# Patient Record
Sex: Female | Born: 1970
Health system: Southern US, Community
[De-identification: ages and names within clinical notes are randomized; demographics above are authoritative.]

## PROBLEM LIST (undated history)

## (undated) DIAGNOSIS — B9689 Other specified bacterial agents as the cause of diseases classified elsewhere: Secondary | ICD-10-CM

## (undated) DIAGNOSIS — Z8659 Personal history of other mental and behavioral disorders: Secondary | ICD-10-CM

## (undated) DIAGNOSIS — K219 Gastro-esophageal reflux disease without esophagitis: Secondary | ICD-10-CM

## (undated) DIAGNOSIS — N760 Acute vaginitis: Secondary | ICD-10-CM

## (undated) DIAGNOSIS — D649 Anemia, unspecified: Secondary | ICD-10-CM

## (undated) DIAGNOSIS — F419 Anxiety disorder, unspecified: Secondary | ICD-10-CM

## (undated) DIAGNOSIS — Z9851 Tubal ligation status: Secondary | ICD-10-CM

## (undated) DIAGNOSIS — Z87440 Personal history of urinary (tract) infections: Secondary | ICD-10-CM

## (undated) DIAGNOSIS — N946 Dysmenorrhea, unspecified: Secondary | ICD-10-CM

## (undated) DIAGNOSIS — A419 Sepsis, unspecified organism: Secondary | ICD-10-CM

## (undated) DIAGNOSIS — N12 Tubulo-interstitial nephritis, not specified as acute or chronic: Secondary | ICD-10-CM

## (undated) DIAGNOSIS — R102 Pelvic and perineal pain unspecified side: Secondary | ICD-10-CM

## (undated) HISTORY — DX: Pelvic and perineal pain: R10.2

## (undated) HISTORY — DX: Other specified bacterial agents as the cause of diseases classified elsewhere: B96.89

## (undated) HISTORY — DX: Anemia, unspecified: D64.9

## (undated) HISTORY — DX: Sepsis, unspecified organism: A41.9

## (undated) HISTORY — PX: OTHER SURGICAL HISTORY: SHX169

## (undated) HISTORY — DX: Tubal ligation status: Z98.51

## (undated) HISTORY — PX: ABDOMINAL HYSTERECTOMY: SHX81

## (undated) HISTORY — DX: Anxiety disorder, unspecified: F41.9

## (undated) HISTORY — DX: Acute vaginitis: N76.0

## (undated) HISTORY — DX: Pelvic and perineal pain unspecified side: R10.20

## (undated) HISTORY — DX: Gastro-esophageal reflux disease without esophagitis: K21.9

## (undated) HISTORY — DX: Tubulo-interstitial nephritis, not specified as acute or chronic: N12

## (undated) HISTORY — PX: VAGINAL HYSTERECTOMY: SHX2639

## (undated) HISTORY — DX: Personal history of other mental and behavioral disorders: Z86.59

## (undated) HISTORY — DX: Personal history of urinary (tract) infections: Z87.440

## (undated) HISTORY — DX: Dysmenorrhea, unspecified: N94.6

---

## 2002-03-10 ENCOUNTER — Emergency Department (HOSPITAL_COMMUNITY): Admission: EM | Admit: 2002-03-10 | Discharge: 2002-03-11 | Payer: Self-pay | Admitting: Emergency Medicine

## 2002-07-08 ENCOUNTER — Emergency Department (HOSPITAL_COMMUNITY): Admission: EM | Admit: 2002-07-08 | Discharge: 2002-07-08 | Payer: Self-pay

## 2007-09-30 ENCOUNTER — Emergency Department (HOSPITAL_BASED_OUTPATIENT_CLINIC_OR_DEPARTMENT_OTHER): Admission: EM | Admit: 2007-09-30 | Discharge: 2007-10-01 | Payer: Self-pay | Admitting: Emergency Medicine

## 2008-02-23 ENCOUNTER — Encounter: Payer: Self-pay | Admitting: Emergency Medicine

## 2008-02-24 ENCOUNTER — Inpatient Hospital Stay (HOSPITAL_COMMUNITY): Admission: EM | Admit: 2008-02-24 | Discharge: 2008-02-27 | Payer: Self-pay | Admitting: Internal Medicine

## 2008-03-17 ENCOUNTER — Ambulatory Visit (HOSPITAL_COMMUNITY): Admission: RE | Admit: 2008-03-17 | Discharge: 2008-03-17 | Payer: Self-pay | Admitting: Urology

## 2009-12-06 ENCOUNTER — Encounter: Payer: Self-pay | Admitting: Obstetrics and Gynecology

## 2009-12-06 ENCOUNTER — Inpatient Hospital Stay (HOSPITAL_COMMUNITY): Admission: RE | Admit: 2009-12-06 | Discharge: 2009-12-07 | Payer: Self-pay | Admitting: Obstetrics and Gynecology

## 2010-06-09 LAB — CBC
HCT: 29.9 % — ABNORMAL LOW (ref 36.0–46.0)
HCT: 38 % (ref 36.0–46.0)
Hemoglobin: 10.2 g/dL — ABNORMAL LOW (ref 12.0–15.0)
MCH: 30.8 pg (ref 26.0–34.0)
MCH: 30.9 pg (ref 26.0–34.0)
MCV: 90.9 fL (ref 78.0–100.0)
RBC: 3.32 MIL/uL — ABNORMAL LOW (ref 3.87–5.11)
RDW: 13.6 % (ref 11.5–15.5)
WBC: 8.9 10*3/uL (ref 4.0–10.5)

## 2010-06-09 LAB — BASIC METABOLIC PANEL
BUN: 6 mg/dL (ref 6–23)
CO2: 28 mEq/L (ref 19–32)
Calcium: 9.3 mg/dL (ref 8.4–10.5)
Chloride: 106 mEq/L (ref 96–112)
Creatinine, Ser: 0.71 mg/dL (ref 0.4–1.2)
GFR calc Af Amer: >60 mL/min (ref >60)
GFR calc non Af Amer: >60 mL/min (ref >60)
Glucose, Bld: 95 mg/dL (ref 70–99)
Potassium: 3.8 mEq/L (ref 3.5–5.1)
Sodium: 140 mEq/L (ref 135–145)

## 2010-06-09 LAB — CREATININE, SERUM
Creatinine, Ser: 0.59 mg/dL (ref 0.4–1.2)
GFR calc Af Amer: >60 mL/min (ref >60)

## 2010-06-09 LAB — HCG, SERUM, QUALITATIVE: Preg, Serum: NEGATIVE

## 2010-08-09 NOTE — Discharge Summary (Signed)
NAME:  Kaitlin Jenkins, Kaitlin Jenkins NO.:  [id number]   MEDICAL RECORD NO.:  [id number]          PATIENT TYPE:  INP   LOCATION:  1516                         FACILITY:  [facility]   PHYSICIAN:  Hillery Aldo, M.D.   DATE OF BIRTH:  1970/04/28   DATE OF ADMISSION:  02/24/2008  DATE OF DISCHARGE:  02/27/2008                               DISCHARGE SUMMARY   PRIMARY CARE PHYSICIAN:  None.   DISCHARGE DIAGNOSES:  1. Sepsis.  2. Right pyelonephritis.  3. Bacterial vaginosis.  4. Mild normocytic anemia.  5. Gastroesophageal reflux disease.  6. History of depression.  7. History of recurrent urinary tract infections.   DISCHARGE MEDICATIONS:  1. Ceftin 500 mg b.i.d. times 11 days.  2. Zofran oral dissolving tablet 4 mg sublingual q. 6 hours p.r.n.      nausea.  3  Vicodin 5/500 one to two tabs q.6 h p.r.n. pain.  1. Flagyl 500 mg q.8 hours times 4 more days.  2. Protonix 40 mg daily p.r.n. acid reflux symptoms.  3. Zoloft 100 mg daily.   CONSULTATIONS:  None.   BRIEF ADMISSION HPI:  The patient is a 40 year old female with past  medical history of recurrent urinary tract infections who presented to  the hospital with chief complaint of fever, chills and right-sided flank  and abdominal pain.  The pain progressed to the point where she  developed persistent nausea and vomiting and was unable to keep any food  or liquid down and therefore presented to the emergency department for  evaluation.  For full details, please see the dictated report done by  Dr. Lovell Sheehan.   PROCEDURES AND DIAGNOSTIC STUDIES:  1. CT scan of the abdomen and pelvis on February 23, 2008, showed      findings of right-sided pyelonephritis without abscess.  No renal      calculi.  Suboptimal vascular enhancement of indeterminate      etiology.  Normal appendix.  Normal pelvis.  2. Two views of the chest on February 25, 2008, showed probable      bibasilar atelectasis with tiny effusions.   DISCHARGE  LABORATORY VALUES:  Sodium was 143, potassium 4.6, chloride  107, bicarb 32, BUN 7, creatinine 0.84, glucose 94.  White blood cell  count was 6.2, hemoglobin 11.2, hematocrit 33, platelets 284.   HOSPITAL COURSE:  1. Sepsis secondary to right-sided pyelonephritis:  The patient was      admitted to the ICU because she was initially hypotensive.  She was      fluid volume resuscitated and put on a combination of Cipro and      Rocephin.  She did develop some pruritus of the hand and arm where      the Cipro was actively infusing and therefore Cipro was immediately      discontinued.  The patient remained on Rocephin monotherapy.      Unfortunately, urine cultures were not sent on initial presentation      before the patient received antibiotics and subsequent urine      culture was sent after antibiotics were already initiated.  These  cultures were sterile.  Nevertheless, the patient seems to be      improving on empiric Rocephin therapy and therefore will be      switched over to p.o. Ceftin for a full 2 week course of antibiotic      therapy.  Because of her recurrent problems with urinary tract      infections the patient has been advised that she should follow up      with a urologist for consideration of cystoscopy and long-term      suppressive antibiotic therapy.  The phone number to [facility] was provided for the patient upon discharge.      The patient stated that she preferred to make her own appointment      given her scheduling concerns.  2. Bacterial vaginosis:  During the course of the patient's evaluation      she did undergo a pelvic exam and wet prep findings were consistent      with bacterial vaginosis including many clue cells and white blood      cells in the preparation.  There was no yeast or Trichomonas noted.      The patient was subsequently started on a 7 day course of Flagyl.      She was instructed to avoid all alcohol while on this  medication.  3. Mild normocytic anemia:  No further diagnostic workup was      undertaken in this normal menstruating female.  4. Gastroesophageal reflux disease:  The patient complained of burning      type chest pain on hospital day #1.  According to ICU protocols a      12-lead EKG was done which showed normal sinus rhythm and no ST or      T-wave abnormalities.  One set of cardiac markers was completely      normal.  This was not felt to be consistent with anything of a      cardiac etiology and her symptoms were most consistent with reflux      disease.  She was maintained on Protonix and given a prescription      at discharge for Protonix to use as needed for burning type chest      pain.   DISPOSITION:  The patient is medically stable for discharge.  She does  not have a primary care physician and has been provided with the  physician referral phone number so that she can obtain one if she cannot  find one of her own accord.  Additionally, she has been provided with  the number to [facility].  She is encouraged to obtain  a primary care physician for regular followup and to follow up with the  urologist for evaluation of her recurrent urinary tract infections.   Time spent coordinating care for discharge and discharge instructions  equals 35 minutes.      Hillery Aldo, M.D.  Electronically Signed     CR/MEDQ  D:  02/27/2008  T:  02/27/2008  Job:  [id number]

## 2010-08-09 NOTE — H&P (Signed)
NAME:  Kaitlin Jenkins, Kaitlin Jenkins NO.:  [id number]   MEDICAL RECORD NO.:  [id number]          PATIENT TYPE:  INP   LOCATION:  1241                         FACILITY:  [facility]   PHYSICIAN:  Della Goo, M.D. DATE OF BIRTH:  1970-11-04   DATE OF ADMISSION:  02/24/2008  DATE OF DISCHARGE:                              HISTORY & PHYSICAL   PRIMARY CARE PHYSICIAN:  Unassigned.   CHIEF COMPLAINT:  Fever, chills.   HISTORY OF PRESENT ILLNESS:  This is a 40 year old female who presents  to the [facility] in [facility] with complaints of fevers,  chills for 5 days along with myalgias and malaise.  The patient reports  beginning to develop right-sided flank pain and abdominal pain and began  to have nausea and vomiting over the past 24 hours.  The patient states  that she was working on her job and was advised by her coworkers to  report to the hospital for an evaluation secondary to worsening of her  symptoms.  The patient states that she felt that she may have had the  flu.  She also reports having poor intake of foods and liquids secondary  to her symptoms.   The patient was evaluated in the emergency department and was found to  have a urinalysis which was positive for nitrites and leukocyte  esterase.  The patient was also found on the evaluation to be febrile  and hypotensive.  The patient was started on antibiotic therapy along  with IV fluids.  She was transferred to the [facility] for  admission and continued therapy.   PAST MEDICAL HISTORY:  History of recurrent urinary tract infections.   PAST SURGICAL HISTORY:  1. History of four C-sections.  2. Bilateral tubal ligation.  3. Right breast cyst removal.  4. Right ovarian cyst excision.   MEDICATIONS:  Zoloft.   ALLERGIES:  MORPHINE WHICH CAUSES NAUSEA AND VOMITING AND SULFA.   SOCIAL HISTORY:  The patient is married with children.  She is a smoker  and smokes 3-4 cigarettes daily.  She is a  nondrinker and no history of  illicit drug usage.   FAMILY HISTORY:  Noncontributory.   REVIEW OF SYSTEMS:  The patient reports having fevers, chills, nausea,  vomiting, denies diarrhea.  Denies constipation.  She does report right-  sided back pain and lower abdominal pain.  Denies chest pain, shortness  of breath.  She does report weakness, myalgias, malaise.  She denies  having any syncope, decreased level of consciousness.  Denies having any  vaginal discharge.  Denies any change in her urination and denies  hematuria.  She denies any changes in her weight up or down.  Denies  having any hematemesis, hematochezia or melena passage.  Denies having  any visual changes.  Reports fatigue and denies any dyspnea on exertion  or changes in her exercise tolerance.   PHYSICAL EXAMINATION FINDINGS:  GENERAL:  This is a 40 year old well-  nourished, well-developed female who is in discomfort but no acute  distress currently.  VITAL SIGNS:  Reveal a temperature of 100.4, blood pressure  ranging from  110/67 to 87/47, heart rate 88-94, respirations 18, O2 sats 97-100%.  HEENT: Examination normocephalic, atraumatic.  Pupils equally round  reactive to light.  Extraocular movements are intact funduscopic benign.  Oropharynx clear.  Mucosa dry.  NECK:  Supple.  Full range of motion.  No thyromegaly, adenopathy,  jugular venous distention.  CARDIOVASCULAR:  Regular rate and rhythm.  No murmurs, gallops or rubs.  LUNGS:  Clear to auscultation bilaterally.  ABDOMEN:  Positive bowel sounds, soft, nontender, nondistended.  No  hepatosplenomegaly.  No rebound.  No guarding.  Back:  Positive right-sided costovertebral angle tenderness.  EXTREMITIES: Without cyanosis, clubbing or edema.  NEUROLOGIC:  Nonfocal.   LABORATORY STUDIES:  The urinalysis as mentioned above.  Positive  nitrites, small leukocyte esterase, cloudy yellow urine, microscopic 7-  10 white blood cells per high-power field, many  bacteria.  White blood  cell count 8.6, hemoglobin 11.9, hematocrit 35.8, MCV 88.2, platelets  300, neutrophils 70% lymphocytes 18%.  Sodium 140, potassium 3.8,  chloride 102, carbon dioxide 29, BUN 9, creatinine 0.7, glucose 86,  albumin 4.3, AST 21, ALT 15, alkaline phosphatase 93, lipase 38.  Wet  prep performed by EDP reveals many clue cells, negative yeast, negative  Trichomonas.  CT scan of the abdomen and pelvis results reveals moderate  right-sided perirenal edema with areas of heterogeneous enhancement.  No  renal calculi.  No retroperitoneal or retrocrural adenopathy.   ASSESSMENT:  A 40 year old female being admitted with:  1. Sepsis/septic shock versus SIRS syndrome.  2. Right-sided pyelonephritis.  3. Hypotension.  4. Bacterial vaginosis.   PLAN:  The patient has been admitted to the ICU area.  She has been  placed on IV antibiotic therapy of ciprofloxacin and IV Rocephin has  also been added.  Oral Flagyl therapy has also been started.  The  patient will continue on IV fluids for fluid resuscitation.  The patient  will also be placed on Tylenol therapy p.r.n. fever and also pain  control and antiemetic therapy has also been ordered.  The patient will  also be placed on DVT and GI prophylaxis as well.  Further workup will  ensue pending results of the patient's cultures which have been sent and  her clinical course.      Della Goo, M.D.  Electronically Signed     HJ/MEDQ  D:  02/25/2008  T:  02/25/2008  Job:  [id number]

## 2010-12-22 LAB — URINE CULTURE: Colony Count: >=100000

## 2010-12-22 LAB — URINALYSIS, ROUTINE W REFLEX MICROSCOPIC
Ketones, ur: NEGATIVE
Nitrite: NEGATIVE
Protein, ur: NEGATIVE
pH: 6.5

## 2010-12-22 LAB — BASIC METABOLIC PANEL WITH GFR
BUN: 12
CO2: 26
Calcium: 9.2
Chloride: 107
Creatinine, Ser: 0.7
GFR calc non Af Amer: >60
Glucose, Bld: 98
Potassium: 5.1
Sodium: 140

## 2010-12-22 LAB — CBC
HCT: 35.8 — ABNORMAL LOW
Hemoglobin: 12.3
MCHC: 34.3
MCV: 86.2
Platelets: 281
RBC: 4.16
RDW: 12.6
WBC: 8.6

## 2010-12-22 LAB — DIFFERENTIAL
Basophils Relative: 4 — ABNORMAL HIGH
Lymphs Abs: 1.9
Monocytes Relative: 8
Neutro Abs: 5.5
Neutrophils Relative %: 64

## 2010-12-22 LAB — PREGNANCY, URINE: Preg Test, Ur: NEGATIVE

## 2010-12-27 LAB — URINALYSIS, ROUTINE W REFLEX MICROSCOPIC
Hgb urine dipstick: NEGATIVE
Specific Gravity, Urine: 1.017 (ref 1.005–1.030)
Urobilinogen, UA: 0.2 mg/dL (ref 0.0–1.0)

## 2010-12-27 LAB — CARDIAC PANEL(CRET KIN+CKTOT+MB+TROPI)
CK, MB: 0.4 ng/mL (ref 0.3–4.0)
Total CK: 26 U/L (ref 7–177)
Troponin I: <0.01 ng/mL (ref 0.00–0.06)

## 2010-12-27 LAB — WET PREP, GENITAL: Yeast Wet Prep HPF POC: NONE SEEN

## 2010-12-27 LAB — URINE MICROSCOPIC-ADD ON

## 2010-12-27 LAB — BASIC METABOLIC PANEL
BUN: 9 mg/dL (ref 6–23)
Calcium: 9.2 mg/dL (ref 8.4–10.5)
Creatinine, Ser: 0.7 mg/dL (ref 0.4–1.2)
GFR calc non Af Amer: >60 mL/min (ref >60)
Glucose, Bld: 86 mg/dL (ref 70–99)
Sodium: 140 mEq/L (ref 135–145)

## 2010-12-27 LAB — HEPATIC FUNCTION PANEL
ALT: 15 U/L (ref 0–35)
Alkaline Phosphatase: 93 U/L (ref 39–117)
Bilirubin, Direct: 0 mg/dL (ref 0.0–0.3)
Indirect Bilirubin: 0.4 mg/dL (ref 0.3–0.9)
Total Bilirubin: 0.4 mg/dL (ref 0.3–1.2)

## 2010-12-27 LAB — CBC
MCHC: 33.1 g/dL (ref 30.0–36.0)
Platelets: 300 10*3/uL (ref 150–400)
RDW: 12.5 % (ref 11.5–15.5)

## 2010-12-27 LAB — DIFFERENTIAL
Basophils Absolute: 0.1 10*3/uL (ref 0.0–0.1)
Basophils Relative: 1 % (ref 0–1)
Lymphocytes Relative: 18 % (ref 12–46)
Neutro Abs: 6 10*3/uL (ref 1.7–7.7)

## 2010-12-27 LAB — PREGNANCY, URINE: Preg Test, Ur: NEGATIVE

## 2010-12-30 LAB — CBC
HCT: 33 % — ABNORMAL LOW (ref 36.0–46.0)
MCV: 89.6 fL (ref 78.0–100.0)
Platelets: 213 10*3/uL (ref 150–400)
Platelets: 284 10*3/uL (ref 150–400)
RBC: 3.21 MIL/uL — ABNORMAL LOW (ref 3.87–5.11)
RDW: 12.7 % (ref 11.5–15.5)
WBC: 6.2 10*3/uL (ref 4.0–10.5)
WBC: 6.2 10*3/uL (ref 4.0–10.5)

## 2010-12-30 LAB — BASIC METABOLIC PANEL
BUN: 7 mg/dL (ref 6–23)
Calcium: 9 mg/dL (ref 8.4–10.5)
Chloride: 106 mEq/L (ref 96–112)
Creatinine, Ser: 0.62 mg/dL (ref 0.4–1.2)
Creatinine, Ser: 0.84 mg/dL (ref 0.4–1.2)
GFR calc Af Amer: >60 mL/min (ref >60)
GFR calc non Af Amer: >60 mL/min (ref >60)
GFR calc non Af Amer: >60 mL/min (ref >60)
Glucose, Bld: 94 mg/dL (ref 70–99)
Potassium: 4.6 mEq/L (ref 3.5–5.1)
Potassium: 4.7 mEq/L (ref 3.5–5.1)

## 2010-12-30 LAB — URINE CULTURE
Colony Count: NO GROWTH
Culture: NO GROWTH

## 2011-06-16 ENCOUNTER — Ambulatory Visit: Payer: Self-pay | Admitting: Cardiology

## 2014-11-17 ENCOUNTER — Encounter (HOSPITAL_BASED_OUTPATIENT_CLINIC_OR_DEPARTMENT_OTHER): Payer: Self-pay | Admitting: Emergency Medicine

## 2014-11-17 ENCOUNTER — Emergency Department (HOSPITAL_BASED_OUTPATIENT_CLINIC_OR_DEPARTMENT_OTHER): Payer: Medicaid Other

## 2014-11-17 ENCOUNTER — Emergency Department (HOSPITAL_BASED_OUTPATIENT_CLINIC_OR_DEPARTMENT_OTHER)
Admission: EM | Admit: 2014-11-17 | Discharge: 2014-11-17 | Disposition: A | Discharge status: Home / Self Care | Payer: Medicaid Other | Attending: Emergency Medicine | Admitting: Emergency Medicine

## 2014-11-17 DIAGNOSIS — F329 Major depressive disorder, single episode, unspecified: Secondary | ICD-10-CM | POA: Insufficient documentation

## 2014-11-17 DIAGNOSIS — Z79899 Other long term (current) drug therapy: Secondary | ICD-10-CM | POA: Insufficient documentation

## 2014-11-17 DIAGNOSIS — Z87440 Personal history of urinary (tract) infections: Secondary | ICD-10-CM | POA: Diagnosis not present

## 2014-11-17 DIAGNOSIS — Z87448 Personal history of other diseases of urinary system: Secondary | ICD-10-CM | POA: Insufficient documentation

## 2014-11-17 DIAGNOSIS — Z8619 Personal history of other infectious and parasitic diseases: Secondary | ICD-10-CM | POA: Insufficient documentation

## 2014-11-17 DIAGNOSIS — Z720 Tobacco use: Secondary | ICD-10-CM | POA: Diagnosis not present

## 2014-11-17 DIAGNOSIS — S20212A Contusion of left front wall of thorax, initial encounter: Secondary | ICD-10-CM

## 2014-11-17 DIAGNOSIS — Y92009 Unspecified place in unspecified non-institutional (private) residence as the place of occurrence of the external cause: Secondary | ICD-10-CM | POA: Insufficient documentation

## 2014-11-17 DIAGNOSIS — Z8719 Personal history of other diseases of the digestive system: Secondary | ICD-10-CM | POA: Insufficient documentation

## 2014-11-17 DIAGNOSIS — Z8742 Personal history of other diseases of the female genital tract: Secondary | ICD-10-CM | POA: Insufficient documentation

## 2014-11-17 DIAGNOSIS — Y998 Other external cause status: Secondary | ICD-10-CM | POA: Insufficient documentation

## 2014-11-17 DIAGNOSIS — W109XXA Fall (on) (from) unspecified stairs and steps, initial encounter: Secondary | ICD-10-CM | POA: Diagnosis not present

## 2014-11-17 DIAGNOSIS — Y9389 Activity, other specified: Secondary | ICD-10-CM | POA: Insufficient documentation

## 2014-11-17 DIAGNOSIS — Z862 Personal history of diseases of the blood and blood-forming organs and certain disorders involving the immune mechanism: Secondary | ICD-10-CM | POA: Insufficient documentation

## 2014-11-17 DIAGNOSIS — S299XXA Unspecified injury of thorax, initial encounter: Secondary | ICD-10-CM | POA: Diagnosis present

## 2014-11-17 MED ORDER — HYDROCODONE-ACETAMINOPHEN 5-325 MG PO TABS: 1.0000 | ORAL_TABLET | ORAL | Status: DC | PRN

## 2014-11-17 NOTE — ED Notes (Signed)
Patient transported to X-ray 

## 2014-11-17 NOTE — Discharge Instructions (Signed)

## 2014-11-17 NOTE — ED Notes (Signed)
Pt states she fell last Friday and started having right rib pain

## 2014-11-17 NOTE — ED Provider Notes (Signed)
CSN: [id number]     Arrival date & time 11/17/14  4098 History  This chart was scribed for Gilda Crease, MD by Octavia Heir, ED Scribe. This patient was seen in room MH01/MH01 and the patient's care was started at 7:03 PM.    Chief Complaint  Patient presents with  . Rib pain      The history is provided by the patient. No language interpreter was used.   HPI Comments: Kaitlin Jenkins is a 44 y.o. female who presents to the Emergency Department complaining of intermittent, gradual worsening left rib pain onset 5 days ago. Pt notes she missed the last step at her sisters house and fell on her left side. She notes the pain has not gone away since then. She notes laying down or turning over in bed makes her pain worse but when she is sitting in certain positions the pain alleviates. Pt has been taking ibuprofen to alleviate the pain with minimal relief. Pt notes she twisted her ankle but the pain went away.   Past Medical History  Diagnosis Date  . Pelvic pain   . Dysmenorrhea   . Endometriosis     history of endometriosis  . H/O tubal ligation   . Sepsis(995.91)   . Pyelonephritis     Right pyelonephritis  . Bacterial vaginosis   . Anemia     Mild normocytic anemia  . Gastroesophageal reflux disease   . History of depression   . History of frequent urinary tract infections    Past Surgical History  Procedure Laterality Date  . Vaginal hysterectomy      with left salpingo-oophorectomy  . Cesarean section    . Other surgical history      Right breast cyst removal--Right ovarian cyst excision   History reviewed. No pertinent family history. Social History  Substance Use Topics  . Smoking status: Current Every Day Smoker  . Smokeless tobacco: None  . Alcohol Use: No   OB History    No data available     Review of Systems  Musculoskeletal:       Rib pain  All other systems reviewed and are negative.     Allergies  Morphine and related and Sulfa drugs  cross reactors  Home Medications   Prior to Admission medications   Medication Sig Start Date End Date Taking? Authorizing Provider  sertraline (ZOLOFT) 50 MG tablet Take 50 mg by mouth daily.   Yes Historical Provider, MD   Triage vitals: BP 112/70 mmHg  Pulse 81  Temp(Src) 97.9 F (36.6 C) (Oral)  Resp 18  Ht 5\' 9"  (1.753 m)  Wt 218 lb (98.884 kg)  BMI 32.18 kg/m2  SpO2 97% Physical Exam  Constitutional: She is oriented to person, place, and time. She appears well-developed and well-nourished. No distress.  HENT:  Head: Normocephalic and atraumatic.  Right Ear: Hearing normal.  Left Ear: Hearing normal.  Nose: Nose normal.  Mouth/Throat: Oropharynx is clear and moist and mucous membranes are normal.  Eyes: Conjunctivae and EOM are normal. Pupils are equal, round, and reactive to light.  Neck: Normal range of motion. Neck supple.  Cardiovascular: Regular rhythm, S1 normal and S2 normal.  Exam reveals no gallop and no friction rub.   No murmur heard. Pulmonary/Chest: Effort normal and breath sounds normal. No respiratory distress. She exhibits no tenderness.  Abdominal: Soft. Normal appearance and bowel sounds are normal. There is no hepatosplenomegaly. There is no tenderness. There is no rebound, no guarding,  no tenderness at McBurney's point and negative Murphy's sign. No hernia.  Musculoskeletal: Normal range of motion.  Tender left mid ribs, no crepitus  Neurological: She is alert and oriented to person, place, and time. She has normal strength. No cranial nerve deficit or sensory deficit. Coordination normal. GCS eye subscore is 4. GCS verbal subscore is 5. GCS motor subscore is 6.  Skin: Skin is warm, dry and intact. No rash noted. No cyanosis.  Psychiatric: She has a normal mood and affect. Her speech is normal and behavior is normal. Thought content normal.  Nursing note and vitals reviewed.   ED Course  Procedures  DIAGNOSTIC STUDIES: Oxygen Saturation is 97% on RA,  normal by my interpretation.  COORDINATION OF CARE:  7:05 PM Discussed treatment plan x-ray of left ribs with pt at bedside and pt agreed to plan.  Labs Review Labs Reviewed - No data to display  Imaging Review No results found. I have personally reviewed and evaluated these images and lab results as part of my medical decision-making.   EKG Interpretation None      MDM   Final diagnoses:  None   chest wall contusion  Presents to the ER for evaluation of left rib pain that has been present since a fall that occurred 1 week ago. Patient has been using ibuprofen without much improvement. She is concerned because it is now a week later and she is still expressing pain. There is no difficulty breathing. No fever or cough. Patient's oxygenation is normal. Lung fields were normal. Chest x-ray and rib films did not show any acute pathology. Likely has a hairline nondisplaced rib fracture, possibly simply contusion. Continue her analgesia and rest.  I personally performed the services described in this documentation, which was scribed in my presence. The recorded information has been reviewed and is accurate.   Gilda Crease, MD 11/17/14 [phone]

## 2015-06-29 DIAGNOSIS — J159 Unspecified bacterial pneumonia: Secondary | ICD-10-CM | POA: Diagnosis not present

## 2015-07-06 DIAGNOSIS — N946 Dysmenorrhea, unspecified: Secondary | ICD-10-CM | POA: Diagnosis not present

## 2015-07-06 DIAGNOSIS — Z01419 Encounter for gynecological examination (general) (routine) without abnormal findings: Secondary | ICD-10-CM | POA: Diagnosis not present

## 2015-07-06 DIAGNOSIS — Z1151 Encounter for screening for human papillomavirus (HPV): Secondary | ICD-10-CM | POA: Diagnosis not present

## 2015-07-06 DIAGNOSIS — N898 Other specified noninflammatory disorders of vagina: Secondary | ICD-10-CM | POA: Diagnosis not present

## 2015-07-06 LAB — HM PAP SMEAR: HM Pap smear: NORMAL

## 2015-09-15 DIAGNOSIS — H9202 Otalgia, left ear: Secondary | ICD-10-CM | POA: Diagnosis not present

## 2015-09-15 DIAGNOSIS — Z6835 Body mass index (BMI) 35.0-35.9, adult: Secondary | ICD-10-CM | POA: Diagnosis not present

## 2016-03-22 ENCOUNTER — Emergency Department (HOSPITAL_BASED_OUTPATIENT_CLINIC_OR_DEPARTMENT_OTHER)
Admission: EM | Admit: 2016-03-22 | Discharge: 2016-03-22 | Disposition: A | Discharge status: Home / Self Care | Payer: Self-pay | Attending: Emergency Medicine | Admitting: Emergency Medicine

## 2016-03-22 ENCOUNTER — Emergency Department (HOSPITAL_BASED_OUTPATIENT_CLINIC_OR_DEPARTMENT_OTHER): Payer: Self-pay

## 2016-03-22 ENCOUNTER — Encounter (HOSPITAL_BASED_OUTPATIENT_CLINIC_OR_DEPARTMENT_OTHER): Payer: Self-pay

## 2016-03-22 DIAGNOSIS — Y939 Activity, unspecified: Secondary | ICD-10-CM | POA: Insufficient documentation

## 2016-03-22 DIAGNOSIS — S59901A Unspecified injury of right elbow, initial encounter: Secondary | ICD-10-CM

## 2016-03-22 DIAGNOSIS — Y92480 Sidewalk as the place of occurrence of the external cause: Secondary | ICD-10-CM | POA: Insufficient documentation

## 2016-03-22 DIAGNOSIS — S5001XA Contusion of right elbow, initial encounter: Secondary | ICD-10-CM | POA: Insufficient documentation

## 2016-03-22 DIAGNOSIS — W010XXA Fall on same level from slipping, tripping and stumbling without subsequent striking against object, initial encounter: Secondary | ICD-10-CM | POA: Insufficient documentation

## 2016-03-22 DIAGNOSIS — Y999 Unspecified external cause status: Secondary | ICD-10-CM | POA: Insufficient documentation

## 2016-03-22 DIAGNOSIS — Z87891 Personal history of nicotine dependence: Secondary | ICD-10-CM | POA: Insufficient documentation

## 2016-03-22 NOTE — ED Provider Notes (Signed)
[facility] Provider Note   CSN: [id number] Arrival date & time: 03/22/16  1120     History   Chief Complaint Chief Complaint  Patient presents with  . Elbow Injury    HPI Kaitlin Jenkins is a 45 y.o. right handed female who presents with right elbow pain and swelling after an injury. She states that she tripped and fell yesterday on the sidewalk. She fell on her right side. When she got up she was not able to move her elbow however after going home and taking Ibuprofen she was able to move her elbow more. She states the swelling got worse today so she came in to the ED to make sure it wasn't broken. She denies shoulder pain, forearm, wrist, hand or finger pain. Denies numbness/tingling.  HPI  Past Medical History:  Diagnosis Date  . Anemia    Mild normocytic anemia  . Bacterial vaginosis   . Dysmenorrhea   . Endometriosis    history of endometriosis  . Gastroesophageal reflux disease   . H/O tubal ligation   . History of depression   . History of frequent urinary tract infections   . Pelvic pain   . Pyelonephritis    Right pyelonephritis  . Sepsis(995.91)     There are no active problems to display for this patient.   Past Surgical History:  Procedure Laterality Date  . ABDOMINAL HYSTERECTOMY    . CESAREAN SECTION    . OTHER SURGICAL HISTORY     Right breast cyst removal--Right ovarian cyst excision  . VAGINAL HYSTERECTOMY     with left salpingo-oophorectomy    OB History    No data available       Home Medications    Prior to Admission medications   Medication Sig Start Date End Date Taking? Authorizing Provider  sertraline (ZOLOFT) 50 MG tablet Take 50 mg by mouth daily.    Historical Provider, MD    Family History No family history on file.  Social History Social History  Substance Use Topics  . Smoking status: Former Games developermoker  . Smokeless tobacco: Never Used  . Alcohol use No     Allergies   Morphine and related and Sulfa  drugs cross reactors   Review of Systems Review of Systems  Musculoskeletal: Positive for arthralgias and joint swelling.  Skin: Positive for color change and wound (Bruising).     Physical Exam Updated Vital Signs BP 114/78 (BP Location: Left Arm)   Pulse 102   Temp 98 F (36.7 C) (Oral)   Resp 16   Ht 5\' 7"  (1.702 m)   Wt 101.6 kg   SpO2 98%   BMI 35.08 kg/m   Physical Exam  Constitutional: She is oriented to person, place, and time. She appears well-developed and well-nourished. No distress.  HENT:  Head: Normocephalic and atraumatic.  Eyes: Conjunctivae are normal. Pupils are equal, round, and reactive to light. Right eye exhibits no discharge. Left eye exhibits no discharge. No scleral icterus.  Neck: Normal range of motion.  Cardiovascular: Normal rate.   Pulmonary/Chest: Effort normal. No respiratory distress.  Abdominal: She exhibits no distension.  Musculoskeletal:  Right elbow: Ecchymosis and mild swelling noted over lateral aspect of elbow joint. Tenderness to palpation over lateral elbow. Decreased ROM of elbow due to pain. FROM of shoulder, wrist, fingers. N/V intact.   Neurological: She is alert and oriented to person, place, and time.  Skin: Skin is warm and dry.  Psychiatric: She has a  normal mood and affect. Her behavior is normal.  Nursing note and vitals reviewed.    ED Treatments / Results  Labs (all labs ordered are listed, but only abnormal results are displayed) Labs Reviewed - No data to display  EKG  EKG Interpretation None       Radiology Dg Elbow Complete Right  Result Date: 03/22/2016 CLINICAL DATA:  Fall. EXAM: RIGHT ELBOW - COMPLETE 3+ VIEW COMPARISON:  None. FINDINGS: There is no evidence of fracture, dislocation, or joint effusion. There is no evidence of arthropathy or other focal bone abnormality. Soft tissues are unremarkable. IMPRESSION: Negative. Electronically Signed   By: Signa Kellaylor  Stroud M.D.   On: 03/22/2016 11:54     Procedures Procedures (including critical care time)  Medications Ordered in ED Medications - No data to display   Initial Impression / Assessment and Plan / ED Course  I have reviewed the triage vital signs and the nursing notes.  Pertinent labs & imaging results that were available during my care of the patient were reviewed by me and considered in my medical decision making (see chart for details).  Clinical Course    45 year old female with right elbow injury. Xray negative for bony pathology. Recommend RICE therapy and NSAIDs. Given sling per pt request. Patient is NAD, non-toxic, with stable VS. Patient is informed of clinical course, understands medical decision making process, and agrees with plan. Opportunity for questions provided and all questions answered. Return precautions given.   Final Clinical Impressions(s) / ED Diagnoses   Final diagnoses:  Elbow injury, right, initial encounter    New Prescriptions New Prescriptions   No medications on file     Bethel BornKelly Marie Harlowe Dowler, PA-C 03/22/16 1516    Jerelyn ScottMartha Linker, MD 03/22/16 [phone]

## 2016-03-22 NOTE — Discharge Instructions (Signed)
Rest - Try to reduce movement of elbow Ice - ice for 20 minutes at a time, several times a day Compression - wear ACE wrap or sling to provide support Elevate - elevate elbow above level of heart to reduce swelling Ibuprofen - take with food. Take up to 3-4 times daily

## 2016-03-22 NOTE — ED Triage Notes (Signed)
Pt states she fell on crack in side walk at [employer] yesterday-pain to right elbow-NAD-steady gait

## 2016-03-22 NOTE — ED Notes (Signed)
Pt given d/c instructions as per chart. Verbalizes understanding. No questions. 

## 2016-04-06 ENCOUNTER — Encounter (HOSPITAL_BASED_OUTPATIENT_CLINIC_OR_DEPARTMENT_OTHER): Payer: Self-pay | Admitting: *Deleted

## 2016-04-06 ENCOUNTER — Emergency Department (HOSPITAL_BASED_OUTPATIENT_CLINIC_OR_DEPARTMENT_OTHER): Payer: BLUE CROSS/BLUE SHIELD

## 2016-04-06 ENCOUNTER — Observation Stay (HOSPITAL_BASED_OUTPATIENT_CLINIC_OR_DEPARTMENT_OTHER)
Admission: EM | Admit: 2016-04-06 | Discharge: 2016-04-09 | Disposition: A | Discharge status: Home / Self Care | Payer: BLUE CROSS/BLUE SHIELD | Attending: General Surgery | Admitting: General Surgery

## 2016-04-06 DIAGNOSIS — R1013 Epigastric pain: Secondary | ICD-10-CM

## 2016-04-06 DIAGNOSIS — N809 Endometriosis, unspecified: Secondary | ICD-10-CM | POA: Insufficient documentation

## 2016-04-06 DIAGNOSIS — N946 Dysmenorrhea, unspecified: Secondary | ICD-10-CM | POA: Diagnosis not present

## 2016-04-06 DIAGNOSIS — Z882 Allergy status to sulfonamides status: Secondary | ICD-10-CM | POA: Insufficient documentation

## 2016-04-06 DIAGNOSIS — K828 Other specified diseases of gallbladder: Secondary | ICD-10-CM | POA: Diagnosis not present

## 2016-04-06 DIAGNOSIS — Z885 Allergy status to narcotic agent status: Secondary | ICD-10-CM | POA: Diagnosis not present

## 2016-04-06 DIAGNOSIS — K811 Chronic cholecystitis: Principal | ICD-10-CM | POA: Insufficient documentation

## 2016-04-06 DIAGNOSIS — D649 Anemia, unspecified: Secondary | ICD-10-CM | POA: Diagnosis not present

## 2016-04-06 DIAGNOSIS — Z419 Encounter for procedure for purposes other than remedying health state, unspecified: Secondary | ICD-10-CM

## 2016-04-06 DIAGNOSIS — Z79899 Other long term (current) drug therapy: Secondary | ICD-10-CM | POA: Insufficient documentation

## 2016-04-06 DIAGNOSIS — K810 Acute cholecystitis: Secondary | ICD-10-CM | POA: Diagnosis not present

## 2016-04-06 DIAGNOSIS — Z87440 Personal history of urinary (tract) infections: Secondary | ICD-10-CM | POA: Diagnosis not present

## 2016-04-06 DIAGNOSIS — Z881 Allergy status to other antibiotic agents status: Secondary | ICD-10-CM | POA: Insufficient documentation

## 2016-04-06 DIAGNOSIS — K219 Gastro-esophageal reflux disease without esophagitis: Secondary | ICD-10-CM | POA: Insufficient documentation

## 2016-04-06 DIAGNOSIS — F329 Major depressive disorder, single episode, unspecified: Secondary | ICD-10-CM | POA: Insufficient documentation

## 2016-04-06 DIAGNOSIS — R1011 Right upper quadrant pain: Secondary | ICD-10-CM

## 2016-04-06 DIAGNOSIS — Z90710 Acquired absence of both cervix and uterus: Secondary | ICD-10-CM | POA: Diagnosis not present

## 2016-04-06 DIAGNOSIS — K812 Acute cholecystitis with chronic cholecystitis: Secondary | ICD-10-CM

## 2016-04-06 DIAGNOSIS — Z87891 Personal history of nicotine dependence: Secondary | ICD-10-CM | POA: Insufficient documentation

## 2016-04-06 LAB — COMPREHENSIVE METABOLIC PANEL
ALT: 17 U/L (ref 14–54)
ANION GAP: 8 (ref 5–15)
AST: 20 U/L (ref 15–41)
Albumin: 4.1 g/dL (ref 3.5–5.0)
Alkaline Phosphatase: 87 U/L (ref 38–126)
BUN: 15 mg/dL (ref 6–20)
CALCIUM: 9.4 mg/dL (ref 8.9–10.3)
CHLORIDE: 104 mmol/L (ref 101–111)
CO2: 27 mmol/L (ref 22–32)
Creatinine, Ser: 0.9 mg/dL (ref 0.44–1.00)
Glucose, Bld: 74 mg/dL (ref 65–99)
Potassium: 4.1 mmol/L (ref 3.5–5.1)
Sodium: 139 mmol/L (ref 135–145)
Total Bilirubin: 0.4 mg/dL (ref 0.3–1.2)
Total Protein: 7.3 g/dL (ref 6.5–8.1)

## 2016-04-06 LAB — CBC WITH DIFFERENTIAL/PLATELET
Basophils Absolute: 0 10*3/uL (ref 0.0–0.1)
Basophils Relative: 1 %
EOS ABS: 0.1 10*3/uL (ref 0.0–0.7)
EOS PCT: 2 %
HCT: 39.4 % (ref 36.0–46.0)
Hemoglobin: 13.3 g/dL (ref 12.0–15.0)
LYMPHS ABS: 2.2 10*3/uL (ref 0.7–4.0)
LYMPHS PCT: 34 %
MCH: 29.6 pg (ref 26.0–34.0)
MCHC: 33.8 g/dL (ref 30.0–36.0)
MCV: 87.8 fL (ref 78.0–100.0)
MONO ABS: 0.6 10*3/uL (ref 0.1–1.0)
MONOS PCT: 10 %
Neutro Abs: 3.6 10*3/uL (ref 1.7–7.7)
Neutrophils Relative %: 55 %
PLATELETS: 264 10*3/uL (ref 150–400)
RBC: 4.49 MIL/uL (ref 3.87–5.11)
RDW: 13.1 % (ref 11.5–15.5)
WBC: 6.5 10*3/uL (ref 4.0–10.5)

## 2016-04-06 LAB — URINALYSIS, ROUTINE W REFLEX MICROSCOPIC
BILIRUBIN URINE: NEGATIVE
Glucose, UA: NEGATIVE mg/dL
HGB URINE DIPSTICK: NEGATIVE
Ketones, ur: NEGATIVE mg/dL
Leukocytes, UA: NEGATIVE
NITRITE: NEGATIVE
PROTEIN: NEGATIVE mg/dL
Specific Gravity, Urine: 1.007 (ref 1.005–1.030)
pH: 6 (ref 5.0–8.0)

## 2016-04-06 LAB — LIPASE, BLOOD: LIPASE: 21 U/L (ref 11–51)

## 2016-04-06 MED ORDER — DEXTROSE 5 % IV SOLN
2.0000 g | Freq: Once | INTRAVENOUS | Status: AC
  Administered 2016-04-06: 2 g via INTRAVENOUS
  Filled 2016-04-06: qty 2

## 2016-04-06 MED ORDER — HYDROCODONE-ACETAMINOPHEN 5-325 MG PO TABS
1.0000 | ORAL_TABLET | ORAL | Status: DC | PRN
  Administered 2016-04-06: 1 via ORAL
  Filled 2016-04-06: qty 1

## 2016-04-06 MED ORDER — HYDROMORPHONE HCL 1 MG/ML IJ SOLN
1.0000 mg | INTRAMUSCULAR | Status: DC | PRN
  Administered 2016-04-07 – 2016-04-08 (×5): 1 mg via INTRAVENOUS
  Administered 2016-04-08: 2 mg via INTRAVENOUS
  Administered 2016-04-08 (×3): 1 mg via INTRAVENOUS
  Administered 2016-04-08: 2 mg via INTRAVENOUS
  Administered 2016-04-08: 1 mg via INTRAVENOUS
  Filled 2016-04-06: qty 2
  Filled 2016-04-06 (×3): qty 1
  Filled 2016-04-06: qty 2
  Filled 2016-04-06 (×5): qty 1
  Filled 2016-04-06: qty 2
  Filled 2016-04-06: qty 1

## 2016-04-06 MED ORDER — FENTANYL CITRATE (PF) 100 MCG/2ML IJ SOLN
50.0000 ug | Freq: Once | INTRAMUSCULAR | Status: AC
  Administered 2016-04-06: 50 ug via INTRAVENOUS
  Filled 2016-04-06: qty 2

## 2016-04-06 MED ORDER — ACETAMINOPHEN 650 MG RE SUPP: 650.0000 mg | Freq: Four times a day (QID) | RECTAL | Status: DC | PRN

## 2016-04-06 MED ORDER — ONDANSETRON 4 MG PO TBDP
4.0000 mg | ORAL_TABLET | Freq: Four times a day (QID) | ORAL | Status: DC | PRN
  Administered 2016-04-08 – 2016-04-09 (×2): 4 mg via ORAL
  Filled 2016-04-06 (×3): qty 1

## 2016-04-06 MED ORDER — ONDANSETRON HCL 4 MG/2ML IJ SOLN
4.0000 mg | Freq: Four times a day (QID) | INTRAMUSCULAR | Status: DC | PRN
  Administered 2016-04-07 – 2016-04-09 (×6): 4 mg via INTRAVENOUS
  Filled 2016-04-06 (×6): qty 2

## 2016-04-06 MED ORDER — ACETAMINOPHEN 325 MG PO TABS
650.0000 mg | ORAL_TABLET | Freq: Four times a day (QID) | ORAL | Status: DC | PRN
Start: 2016-04-06 — End: 2016-04-09

## 2016-04-06 MED ORDER — ONDANSETRON HCL 4 MG/2ML IJ SOLN
4.0000 mg | Freq: Once | INTRAMUSCULAR | Status: AC
  Administered 2016-04-06: 4 mg via INTRAVENOUS
  Filled 2016-04-06: qty 2

## 2016-04-06 MED ORDER — FENTANYL CITRATE (PF) 100 MCG/2ML IJ SOLN: 100.0000 ug | Freq: Once | INTRAMUSCULAR | Status: DC

## 2016-04-06 MED ORDER — HYDROMORPHONE HCL 1 MG/ML IJ SOLN
1.0000 mg | Freq: Once | INTRAMUSCULAR | Status: AC
  Administered 2016-04-06: 1 mg via INTRAVENOUS
  Filled 2016-04-06: qty 1

## 2016-04-06 MED ORDER — KCL IN DEXTROSE-NACL 20-5-0.45 MEQ/L-%-% IV SOLN
INTRAVENOUS | Status: DC
  Administered 2016-04-06 – 2016-04-07 (×2): via INTRAVENOUS
  Filled 2016-04-06 (×3): qty 1000

## 2016-04-06 MED ORDER — PANTOPRAZOLE SODIUM 40 MG IV SOLR
40.0000 mg | Freq: Every day | INTRAVENOUS | Status: DC
  Administered 2016-04-06: 40 mg via INTRAVENOUS
  Filled 2016-04-06: qty 40

## 2016-04-06 NOTE — ED Provider Notes (Signed)
[facility] Provider Note   CSN: [id number] Arrival date & time: 04/06/16  1116     History   Chief Complaint Chief Complaint  Patient presents with  . Abdominal Pain    HPI Kaitlin Jenkins is a 46 y.o. female.  HPI Patient presents with 5 day history of moderate, colicky, dull aching RUQ and epigastric abdominal pain with associated nausea that is associated with eating and drinking.  No fever, chills, hematemesis, emesis, CP, SOB, diaphoresis, constipation, black or bloody stools, or urinary symptoms.  She notes looser than normal stools yesterday.  She has tried tums, tylenol, and different positioning with little relief. Eating or drinking makes it worse.  Prior abdominal surgeries include hysterectomy and oophorectomy.  No new medications.  No alcohol use.  No heavy NSAID use.  She made dietary changes at the beginning of the year and cut out fried foods.  This has never happened before.  She does not have a PCP.  She defers pain medication at this time.   Past Medical History:  Diagnosis Date  . Anemia    Mild normocytic anemia  . Bacterial vaginosis   . Dysmenorrhea   . Endometriosis    history of endometriosis  . Gastroesophageal reflux disease   . H/O tubal ligation   . History of depression   . History of frequent urinary tract infections   . Pelvic pain   . Pyelonephritis    Right pyelonephritis  . Sepsis(995.91)     There are no active problems to display for this patient.   Past Surgical History:  Procedure Laterality Date  . ABDOMINAL HYSTERECTOMY    . CESAREAN SECTION    . OTHER SURGICAL HISTORY     Right breast cyst removal--Right ovarian cyst excision  . VAGINAL HYSTERECTOMY     with left salpingo-oophorectomy    OB History    No data available       Home Medications    Prior to Admission medications   Medication Sig Start Date End Date Taking? Authorizing Provider  sertraline (ZOLOFT) 50 MG tablet Take 50 mg by mouth daily.     Historical Provider, MD    Family History No family history on file.  Social History Social History  Substance Use Topics  . Smoking status: Former Games developer  . Smokeless tobacco: Never Used  . Alcohol use No     Allergies   Morphine and related and Sulfa drugs cross reactors   Review of Systems Review of Systems All other systems negative unless otherwise stated in HPI   Physical Exam Updated Vital Signs BP 110/65 (BP Location: Right Arm)   Pulse 67   Temp 98 F (36.7 C) (Oral)   Resp 20   Ht 5\' 7"  (1.702 m)   Wt 101.6 kg   SpO2 98%   BMI 35.08 kg/m   Physical Exam  Constitutional: She is oriented to person, place, and time. She appears well-developed and well-nourished.  Non-toxic appearance. She does not have a sickly appearance. She does not appear ill.  HENT:  Head: Normocephalic and atraumatic.  Mouth/Throat: Oropharynx is clear and moist.  Eyes: Conjunctivae are normal. Pupils are equal, round, and reactive to light.  Neck: Normal range of motion. Neck supple.  Cardiovascular: Normal rate and regular rhythm.   Pulmonary/Chest: Effort normal and breath sounds normal. No accessory muscle usage or stridor. No respiratory distress. She has no wheezes. She has no rhonchi. She has no rales.  Abdominal: Soft. Bowel  sounds are normal. She exhibits no distension. There is tenderness in the right upper quadrant and epigastric area. There is positive Murphy's sign. There is no rigidity, no rebound and no guarding.  Musculoskeletal: Normal range of motion.  Lymphadenopathy:    She has no cervical adenopathy.  Neurological: She is alert and oriented to person, place, and time.  Speech clear without dysarthria.  Skin: Skin is warm and dry.  Psychiatric: She has a normal mood and affect. Her behavior is normal.     ED Treatments / Results  Labs (all labs ordered are listed, but only abnormal results are displayed) Labs Reviewed  CBC WITH DIFFERENTIAL/PLATELET    COMPREHENSIVE METABOLIC PANEL  LIPASE, BLOOD  URINALYSIS, ROUTINE W REFLEX MICROSCOPIC    EKG  EKG Interpretation None       Radiology [place] Abdomen Limited Ruq  Result Date: 04/06/2016 CLINICAL DATA:  Four days of right upper quadrant epigastric pain especially postprandially. EXAM: US ABDOMEN LIMITED - RIGHT UPPER QUADRANT COMPARISON:  None in PACs FINDINGS: Gallbladder: The gallbladder is adequately distended. There is minimal wall thickening focally along the anterior aspect of the gallbladder. There is no evidence of gallbladder polyps, stones, or sludge. There is a positive sonographic Murphy's sign. Common bile duct: Diameter: 1.8 mm Liver: The hepatic echotexture is normal. There is no focal mass or ductal dilation. IMPRESSION: Focal gallbladder wall thickening and positive sonographic Murphy's sign. No stones or sludge are observed. The findings could reflect acute cholecystitis in the appropriate clinical setting. Normal appearing liver and common bile duct. Electronically Signed   By: David  SwazilandJordan M.D.   On: 04/06/2016 13:08    Procedures Procedures (including critical care time)  Medications Ordered in ED Medications  cefTRIAXone (ROCEPHIN) 2 g in dextrose 5 % 50 mL IVPB (not administered)     Initial Impression / Assessment and Plan / ED Course  I have reviewed the triage vital signs and the nursing notes.  Pertinent labs & imaging results that were available during my care of the patient were reviewed by me and considered in my medical decision making (see chart for details).  Clinical Course    Patient presents with epigastric/RUQ abdominal pain with eating with associated nausea.  No fever, chills.  Vitals stable.  Abdominal exam revealed epigastric and RUQ abdominal tenderness with positive Murphy's sign, without rebound, guarding, or rigidity.  Labs without acute abnormality.  Ultrasound showed gallbladder wall thickening and positive Murphy's sign, no stones or  sludge.  Concern for acalculous cholecystitis.  General surgery consulted, Dr. Michaell CowingGross, will give 2g Rocephin [facility]  Appreciate Dr. Ranae PalmsYelverton for transfer.  Final Clinical Impressions(s) / ED Diagnoses   Final diagnoses:  Epigastric abdominal pain  RUQ abdominal pain    New Prescriptions New Prescriptions   No medications on file         Gwinda MaineKayla Cybele Maule, PA-C 04/06/16 1405    Laurence Spatesachel Morgan Little, MD 04/07/16 [phone]

## 2016-04-06 NOTE — Progress Notes (Signed)
Kaitlin Jenkins  [birth year] [id number]  No care team member to display  This patient is a 46 y.o.female   Called by [facility] [place].  Emergency room physician and handed phone to physician Asst. Kaitlin FowlerKayla Jenkins.  Consult requested for patient who has abdominal pain and probable Murphy's sign.  Ultrasound suspicious for cholecystitis.  I recommended transfer the patient to the [facility] emergency department.  I recommend they called [facility] to help set up transportation.  Consult surgery when they arrive for consultation and see if appropriate for admission and cholecystectomy versus need of medical stabilization by inpatient hospitalist service first.     There are no active problems to display for this patient.   Past Medical History:  Diagnosis Date  . Anemia    Mild normocytic anemia  . Bacterial vaginosis   . Dysmenorrhea   . Endometriosis    history of endometriosis  . Gastroesophageal reflux disease   . H/O tubal ligation   . History of depression   . History of frequent urinary tract infections   . Pelvic pain   . Pyelonephritis    Right pyelonephritis  . Sepsis(995.91)     Past Surgical History:  Procedure Laterality Date  . ABDOMINAL HYSTERECTOMY    . CESAREAN SECTION    . OTHER SURGICAL HISTORY     Right breast cyst removal--Right ovarian cyst excision  . VAGINAL HYSTERECTOMY     with left salpingo-oophorectomy    Social History   Social History  . Marital status: Married    Spouse name: N/A  . Number of children: N/A  . Years of education: N/A   Occupational History  . Not on file.   Social History Main Topics  . Smoking status: Former Games developermoker  . Smokeless tobacco: Never Used  . Alcohol use No  . Drug use: No  . Sexual activity: Not on file   Other Topics Concern  . Not on file   Social History Narrative  . No narrative on file    No family history on file.  No current facility-administered medications for this encounter.    Current  Outpatient Prescriptions  Medication Sig Dispense Refill  . ALPRAZolam (XANAX) 0.5 MG tablet Take 0.5 mg by mouth at bedtime as needed for sleep.     Marland Kitchen. CALCIUM PO Take 1 tablet by mouth daily.    . Cholecalciferol (VITAMIN D3) 5000 units CAPS Take 10,000 Units by mouth daily.    Marland Kitchen. estradiol (ESTRACE) 0.1 MG/GM vaginal cream Place 3 g vaginally once a week. wednesdays    . Multiple Vitamins-Minerals (MULTIVITAMIN WITH MINERALS) tablet Take 1 tablet by mouth daily.    . Probiotic Product (PROBIOTIC PO) Take 1 capsule by mouth daily.    . sertraline (ZOLOFT) 100 MG tablet Take 100 mg by mouth at bedtime.   2     Allergies  Allergen Reactions  . Morphine And Related Nausea And Vomiting  . Ciprofloxacin Swelling  . Sulfa Drugs Cross Reactors Other (See Comments)    Blood will not clot, runs very thin; bruises    BP 112/74 (BP Location: Right Arm)   Pulse 73   Temp 98.1 F (36.7 C) (Oral)   Resp 18   Ht 5\' 7"  (1.702 m)   Wt 101.6 kg (224 lb)   SpO2 98%   BMI 35.08 kg/m   Dg Elbow Complete Right  Result Date: 03/22/2016 CLINICAL DATA:  Fall. EXAM: RIGHT ELBOW - COMPLETE 3+ VIEW COMPARISON:  None.  FINDINGS: There is no evidence of fracture, dislocation, or joint effusion. There is no evidence of arthropathy or other focal bone abnormality. Soft tissues are unremarkable. IMPRESSION: Negative. Electronically Signed   By: Signa Kell M.D.   On: 03/22/2016 11:54   US Abdomen Limited Ruq  Result Date: 04/06/2016 CLINICAL DATA:  Four days of right upper quadrant epigastric pain especially postprandially. EXAM: US ABDOMEN LIMITED - RIGHT UPPER QUADRANT COMPARISON:  None in PACs FINDINGS: Gallbladder: The gallbladder is adequately distended. There is minimal wall thickening focally along the anterior aspect of the gallbladder. There is no evidence of gallbladder polyps, stones, or sludge. There is a positive sonographic Murphy's sign. Common bile duct: Diameter: 1.8 mm Liver: The hepatic  echotexture is normal. There is no focal mass or ductal dilation. IMPRESSION: Focal gallbladder wall thickening and positive sonographic Murphy's sign. No stones or sludge are observed. The findings could reflect acute cholecystitis in the appropriate clinical setting. Normal appearing liver and common bile duct. Electronically Signed   By: David  Sw[place]il[place] M.D.   On: 04/06/2016 13:08    Note: This dictation was prepared with Dragon/digital dictation along with [employer]. Any transcriptional errors that result from this process are unintentional.   .Ardeth Sportsman, M.D., F.A.C.S. Gastrointestinal and Minimally Invasive Surgery [facility] [address], [address] [place], [place] [address] [phone] Main / Paging  04/06/2016 6:26 PM

## 2016-04-06 NOTE — ED Notes (Signed)
[facility] and Dorene SorrowJerry EMT-P with [facility].

## 2016-04-06 NOTE — ED Triage Notes (Signed)
Pt transported from [facility] by [facility] for consult to general surgery. Pt c/o abdominal pain for 5 days and had started a new diet at the 1st of the year. Pt has 20G LAC with NS running.

## 2016-04-06 NOTE — ED Triage Notes (Signed)
Upper abdominal pain x 5 days. 2 weeks ago she had a feeling of fullness after eating a meal. Nausea.

## 2016-04-06 NOTE — H&P (Signed)
Kaitlin Jenkins is an 46 y.o. female.    General Surgery [facility]  Chief Complaint: abdominal pain, right upper quadrant, nausea  HPI: patient is a 46 yo WF with two week hx of intermittent abd pain, mostly RUQ, worse over past 5 days.  Moderate nausea, no emesis.  Denies fever or chills.  Loose BM's past 24 hours.  No relief with antacids.  Presented to [place] where labs are normal, USN shows mild focal thickening of gallbladder wall with no gallstones present.  No hx of hepatobiliary or pancreatic disease.  Sister with hx of cholecystectomy.  Prior abdominal surgery includes hysterectomy and C-sections.  Previous EGD and colonoscopy in 2012 in [facility] for abdominal pain and nausea with meals.  Past Medical History:  Diagnosis Date  . Anemia    Mild normocytic anemia  . Bacterial vaginosis   . Dysmenorrhea   . Endometriosis    history of endometriosis  . Gastroesophageal reflux disease   . H/O tubal ligation   . History of depression   . History of frequent urinary tract infections   . Pelvic pain   . Pyelonephritis    Right pyelonephritis  . Sepsis(995.91)     Past Surgical History:  Procedure Laterality Date  . ABDOMINAL HYSTERECTOMY    . CESAREAN SECTION    . OTHER SURGICAL HISTORY     Right breast cyst removal--Right ovarian cyst excision  . VAGINAL HYSTERECTOMY     with left salpingo-oophorectomy    History reviewed. No pertinent family history. Social History:  reports that she has quit smoking. She has never used smokeless tobacco. She reports that she does not drink alcohol or use drugs.  Allergies:  Allergies  Allergen Reactions  . Morphine And Related Nausea And Vomiting  . Ciprofloxacin Swelling  . Sulfa Drugs Cross Reactors Other (See Comments)    Blood will not clot, runs very thin; bruises     (Not in a hospital admission)  Results for orders placed or performed during the hospital encounter of 04/06/16 (from the past  48 hour(s))  Urinalysis, Routine w reflex microscopic     Status: None   Collection Time: 04/06/16 11:45 AM  Result Value Ref Range   Color, Urine YELLOW YELLOW   APPearance CLEAR CLEAR   Specific Gravity, Urine 1.007 1.005 - 1.030   pH 6.0 5.0 - 8.0   Glucose, UA NEGATIVE NEGATIVE mg/dL   Hgb urine dipstick NEGATIVE NEGATIVE   Bilirubin Urine NEGATIVE NEGATIVE   Ketones, ur NEGATIVE NEGATIVE mg/dL   Protein, ur NEGATIVE NEGATIVE mg/dL   Nitrite NEGATIVE NEGATIVE   Leukocytes, UA NEGATIVE NEGATIVE    Comment: Microscopic not done on urines with negative protein, blood, leukocytes, nitrite, or glucose < 500 mg/dL.  CBC with Differential     Status: None   Collection Time: 04/06/16 12:05 PM  Result Value Ref Range   WBC 6.5 4.0 - 10.5 K/uL   RBC 4.49 3.87 - 5.11 MIL/uL   Hemoglobin 13.3 12.0 - 15.0 g/dL   HCT 39.4 36.0 - 46.0 %   MCV 87.8 78.0 - 100.0 fL   MCH 29.6 26.0 - 34.0 pg   MCHC 33.8 30.0 - 36.0 g/dL   RDW 13.1 11.5 - 15.5 %   Platelets 264 150 - 400 K/uL   Neutrophils Relative % 55 %   Neutro Abs 3.6 1.7 - 7.7 K/uL   Lymphocytes Relative 34 %   Lymphs Abs 2.2 0.7 - 4.0  K/uL   Monocytes Relative 10 %   Monocytes Absolute 0.6 0.1 - 1.0 K/uL   Eosinophils Relative 2 %   Eosinophils Absolute 0.1 0.0 - 0.7 K/uL   Basophils Relative 1 %   Basophils Absolute 0.0 0.0 - 0.1 K/uL  Comprehensive metabolic panel     Status: None   Collection Time: 04/06/16 12:05 PM  Result Value Ref Range   Sodium 139 135 - 145 mmol/L   Potassium 4.1 3.5 - 5.1 mmol/L   Chloride 104 101 - 111 mmol/L   CO2 27 22 - 32 mmol/L   Glucose, Bld 74 65 - 99 mg/dL   BUN 15 6 - 20 mg/dL   Creatinine, Ser 0.90 0.44 - 1.00 mg/dL   Calcium 9.4 8.9 - 10.3 mg/dL   Total Protein 7.3 6.5 - 8.1 g/dL   Albumin 4.1 3.5 - 5.0 g/dL   AST 20 15 - 41 U/L   ALT 17 14 - 54 U/L   Alkaline Phosphatase 87 38 - 126 U/L   Total Bilirubin 0.4 0.3 - 1.2 mg/dL   GFR calc non Af Amer >60 >60 mL/min   GFR calc Af Amer  >60 >60 mL/min    Comment: (NOTE) The eGFR has been calculated using the CKD EPI equation. This calculation has not been validated in all clinical situations. eGFR's persistently <60 mL/min signify possible Chronic Kidney Disease.    Anion gap 8 5 - 15  Lipase, blood     Status: None   Collection Time: 04/06/16 12:05 PM  Result Value Ref Range   Lipase 21 11 - 51 U/L   US Abdomen Limited Ruq  Result Date: 04/06/2016 CLINICAL DATA:  Four days of right upper quadrant epigastric pain especially postprandially. EXAM: US ABDOMEN LIMITED - RIGHT UPPER QUADRANT COMPARISON:  None in PACs FINDINGS: Gallbladder: The gallbladder is adequately distended. There is minimal wall thickening focally along the anterior aspect of the gallbladder. There is no evidence of gallbladder polyps, stones, or sludge. There is a positive sonographic Murphy's sign. Common bile duct: Diameter: 1.8 mm Liver: The hepatic echotexture is normal. There is no focal mass or ductal dilation. IMPRESSION: Focal gallbladder wall thickening and positive sonographic Murphy's sign. No stones or sludge are observed. The findings could reflect acute cholecystitis in the appropriate clinical setting. Normal appearing liver and common bile duct. Electronically Signed   By: David  Martinique M.D.   On: 04/06/2016 13:08    Review of Systems  Constitutional: Negative for chills, diaphoresis and fever.  HENT: Negative.   Eyes: Negative.   Respiratory: Negative.   Cardiovascular: Negative.   Gastrointestinal: Positive for abdominal pain (RUQ), diarrhea, heartburn and nausea. Negative for blood in stool, constipation, melena and vomiting.  Genitourinary: Negative.   Musculoskeletal: Negative.   Skin: Negative.   Neurological: Negative.   Endo/Heme/Allergies: Negative.   Psychiatric/Behavioral: Negative.     Blood pressure 110/71, pulse 71, temperature 98.1 F (36.7 C), temperature source Oral, resp. rate 17, height _0  (1.702 m), weight  101.6 kg (224 lb), SpO2 100 %. Physical Exam  Constitutional: She is oriented to person, place, and time. She appears well-developed and well-nourished. No distress.  HENT:  Head: Normocephalic and atraumatic.  Right Ear: External ear normal.  Left Ear: External ear normal.  Mouth/Throat: Oropharynx is clear and moist.  Eyes: Conjunctivae are normal. Pupils are equal, round, and reactive to light. No scleral icterus.  Neck: Normal range of motion. Neck supple. No tracheal deviation present. No thyromegaly  present.  Cardiovascular: Normal rate, regular rhythm and normal heart sounds.   No murmur heard. Respiratory: Effort normal and breath sounds normal. No respiratory distress. She has no wheezes.  GI: Soft. Bowel sounds are normal. She exhibits no distension and no mass. There is tenderness (right side of abdomen, max in RUQ). There is no rebound and no guarding.  Musculoskeletal: Normal range of motion. She exhibits no edema, tenderness or deformity.  Neurological: She is alert and oriented to person, place, and time.  Skin: Skin is warm and dry. She is not diaphoretic.  Psychiatric: She has a normal mood and affect. Her behavior is normal.     Assessment/Plan RUQ abdominal pain, rule out acalculous cholecystitis  Admit to general surgery service  Rx nausea, pain  IV hydration  Clear liquid diet, NPO after MN for HIDA scan in AM 1/12  Possible operative intervention during this admission pending radiology results  Discussed with patient and husband at bedside.  They understand plan and agree to proceed.  Earnstine Regal, MD, [facility] Office: [place], MD 04/06/2016, 8:04 PM

## 2016-04-06 NOTE — ED Notes (Signed)
General surgery at bedside. 

## 2016-04-06 NOTE — ED Provider Notes (Signed)
[place] DEPT Provider Note   CSN: [id number] Arrival date & time: 04/06/16  1116     History   Chief Complaint Chief Complaint  Patient presents with  . Abdominal Pain    HPI Kaitlin Jenkins is a 46 y.o. female with pertinent past medical history of GERD, cesarean surgery 4, hysterectomy and oophorectomy, anemia presents to the ED brought by EMS as a transfer from [facility] ED with symptoms concerning for acalculous cholecystitis.  Patient reports dull, nagging, consistent RUQ and epigastric abdominal pain that occasionally radiates to right middle back associated with nausea and bloating 2 weeks. RUQ pain started after eating dinner two weeks ago, patient initially thought it was gas or indigestion. Since then pain has been a consistent 5/10 but worsens immediately after eating to a 7/10. Aggravating factors include intake of food and water, palpation, laying flat on her back. There are no alleviating factors other than avoiding food or eating less. Patient states that the pain is at its lowest intensity early in the morning as soon as she wakes up and when she has an empty stomach. Patient has tried Tums and Gas-X for symptoms which provided minimal relief. Patient started Weight Watchers diet on 03/27/2016 and has been restricting fried foods, sodas, sugary food. Patient states she has lost 2 pounds so far.  Pt denies fevers, vomiting, chills, hematemesis, dark BMs, CP, SOB, heavy ETOH or ibuprofen use, h/o ulcers.  Pt started having loose stools this morning.   HPI  Past Medical History:  Diagnosis Date  . Anemia    Mild normocytic anemia  . Bacterial vaginosis   . Dysmenorrhea   . Endometriosis    history of endometriosis  . Gastroesophageal reflux disease   . H/O tubal ligation   . History of depression   . History of frequent urinary tract infections   . Pelvic pain   . Pyelonephritis    Right pyelonephritis  . Sepsis(995.91)     Patient Active  Problem List   Diagnosis Date Noted  . Abdominal pain, right upper quadrant 04/06/2016    Past Surgical History:  Procedure Laterality Date  . ABDOMINAL HYSTERECTOMY    . CESAREAN SECTION    . OTHER SURGICAL HISTORY     Right breast cyst removal--Right ovarian cyst excision  . VAGINAL HYSTERECTOMY     with left salpingo-oophorectomy    OB History    No data available       Home Medications    Prior to Admission medications   Medication Sig Start Date End Date Taking? Authorizing Provider  ALPRAZolam Duanne Moron) 0.5 MG tablet Take 0.5 mg by mouth at bedtime as needed for sleep.  06/15/15  Yes Historical Provider, MD  CALCIUM PO Take 1 tablet by mouth daily.   Yes Historical Provider, MD  Cholecalciferol (VITAMIN D3) 5000 units CAPS Take 10,000 Units by mouth daily.   Yes Historical Provider, MD  estradiol (ESTRACE) 0.1 MG/GM vaginal cream Place 3 g vaginally once a week. wednesdays 08/12/15 08/11/16 Yes Historical Provider, MD  Multiple Vitamins-Minerals (MULTIVITAMIN WITH MINERALS) tablet Take 1 tablet by mouth daily.   Yes Historical Provider, MD  Probiotic Product (PROBIOTIC PO) Take 1 capsule by mouth daily.   Yes Historical Provider, MD  sertraline (ZOLOFT) 100 MG tablet Take 100 mg by mouth at bedtime.  03/24/16  Yes Historical Provider, MD    Family History History reviewed. No pertinent family history.  Social History Social History  Substance Use Topics  .  Smoking status: Former Research scientist (life sciences)  . Smokeless tobacco: Never Used  . Alcohol use No     Allergies   Morphine and related; Ciprofloxacin; and Sulfa drugs cross reactors   Review of Systems Review of Systems  Constitutional: Negative for appetite change, chills and fever.  HENT: Negative for congestion and sore throat.   Eyes: Negative for visual disturbance.  Respiratory: Negative for cough, choking, chest tightness and shortness of breath.   Cardiovascular: Negative for chest pain, palpitations and leg  swelling.  Gastrointestinal: Positive for abdominal pain, diarrhea and nausea. Negative for constipation and vomiting.  Genitourinary: Negative for difficulty urinating, dysuria, flank pain and hematuria.  Musculoskeletal: Negative for arthralgias.  Skin: Negative for rash and wound.  Neurological: Negative for dizziness, seizures, syncope, weakness, light-headedness, numbness and headaches.  Hematological: Does not bruise/bleed easily.  Psychiatric/Behavioral: Negative.      Physical Exam Updated Vital Signs BP 110/71 (BP Location: Right Arm)   Pulse 71   Temp 98.1 F (36.7 C) (Oral)   Resp 17   Ht '5\' 7"'  (1.702 m)   Wt 101.6 kg   SpO2 100%   BMI 35.08 kg/m   Physical Exam  Constitutional: She is oriented to person, place, and time. She appears well-developed and well-nourished. No distress.  HENT:  Head: Normocephalic and atraumatic.  Nose: Nose normal.  Mouth/Throat: Oropharynx is clear and moist. No oropharyngeal exudate.  Moist mucous membranes.  Eyes: Conjunctivae and EOM are normal. Pupils are equal, round, and reactive to light.  Neck: Normal range of motion. Neck supple. No JVD present.  Cardiovascular: Normal rate, regular rhythm, normal heart sounds and intact distal pulses.   No murmur heard. Pulmonary/Chest: Effort normal and breath sounds normal. No respiratory distress. She has no wheezes. She has no rales. She exhibits no tenderness.  Abdominal: Soft. Bowel sounds are normal. She exhibits no distension and no mass. There is no tenderness. There is no rebound and no guarding.  + Murphy's. Epigastric tenderness. R CVAT.  Negative McBurney's. Negative psoas sign. No suprapubic tenderness.  Bowel sounds normal.  Abdomen is soft without distention, guarding, mas or rebound.   Musculoskeletal: Normal range of motion. She exhibits no deformity.  Lymphadenopathy:    She has no cervical adenopathy.  Neurological: She is alert and oriented to person, place, and time.  No sensory deficit.  Skin: Skin is warm and dry. Capillary refill takes less than 2 seconds.  Psychiatric: She has a normal mood and affect. Her behavior is normal. Judgment and thought content normal.  Nursing note and vitals reviewed.    ED Treatments / Results  Labs (all labs ordered are listed, but only abnormal results are displayed) Labs Reviewed  CBC WITH DIFFERENTIAL/PLATELET  COMPREHENSIVE METABOLIC PANEL  LIPASE, BLOOD  URINALYSIS, ROUTINE W REFLEX MICROSCOPIC    EKG  EKG Interpretation None       Radiology US Abdomen Limited Ruq  Result Date: 04/06/2016 CLINICAL DATA:  Four days of right upper quadrant epigastric pain especially postprandially. EXAM: US ABDOMEN LIMITED - RIGHT UPPER QUADRANT COMPARISON:  None in PACs FINDINGS: Gallbladder: The gallbladder is adequately distended. There is minimal wall thickening focally along the anterior aspect of the gallbladder. There is no evidence of gallbladder polyps, stones, or sludge. There is a positive sonographic Murphy's sign. Common bile duct: Diameter: 1.8 mm Liver: The hepatic echotexture is normal. There is no focal mass or ductal dilation. IMPRESSION: Focal gallbladder wall thickening and positive sonographic Murphy's sign. No stones or sludge are  observed. The findings could reflect acute cholecystitis in the appropriate clinical setting. Normal appearing liver and common bile duct. Electronically Signed   By: David  Martinique M.D.   On: 04/06/2016 13:08    Procedures Procedures (including critical care time)  Medications Ordered in ED Medications  cefTRIAXone (ROCEPHIN) 2 g in dextrose 5 % 50 mL IVPB (0 g Intravenous Stopped 04/06/16 1442)  fentaNYL (SUBLIMAZE) injection 50 mcg (50 mcg Intravenous Given 04/06/16 1439)  fentaNYL (SUBLIMAZE) injection 50 mcg (50 mcg Intravenous Given 04/06/16 1738)  ondansetron (ZOFRAN) injection 4 mg (4 mg Intravenous Given 04/06/16 1943)  HYDROmorphone (DILAUDID) injection 1 mg (1 mg  Intravenous Given 04/06/16 1943)     Initial Impression / Assessment and Plan / ED Course  I have reviewed the triage vital signs and the nursing notes.  Pertinent labs & imaging results that were available during my care of the patient were reviewed by me and considered in my medical decision making (see chart for details).  Clinical Course as of Apr 06 2005  Thu Apr 06, 2016  1840 Spoke to general surgery who will come to ED and evaluate patient. Pt to be admitted to gen surg.  [CG]    Clinical Course User Index [CG] Kinnie Feil, PA-C   I reviewed pt's [facility] ED work up - U/S remarkable for gallbladder wall thickening and positive Murphy's sign without stones or sludge observed and normal appearing liver and CBD. No leukocytosis on CBC. Lipase wnl. AST, ALT, alk phos wnl. Pt is afebrile, non tachycardic with soft SBP in low 100s.  Physical exam remarkable for + Murphy's sign and epigastric tenderness, no abdominal signs of acute abdomen. Dr. Johney Maine (general surgery) was consulted by EDP at [facility] and recommended transfer to [facility] for evaluation.  Pt was given 2g rocephin and fentanyl at [facility] ED. Will consult general surgery. Will start IVFs, zofran, pain control and keep pt NPO in ED.   General surgery recommends admission for HIDA scan in the morning. Pt will be NPO at midnight today. Pt to get fluids, pain control, zofran in ED until admission.   Final Clinical Impressions(s) / ED Diagnoses   Final diagnoses:  Epigastric abdominal pain  RUQ abdominal pain    New Prescriptions New Prescriptions   No medications on file     Kinnie Feil, PA-C 04/06/16 [place], PA-C 04/06/16 2007    Varney Biles, MD 04/07/16 [phone]

## 2016-04-06 NOTE — ED Notes (Signed)
Report given to [facility] RN with [facility].

## 2016-04-06 NOTE — ED Notes (Signed)
Patient transported to Ultrasound 

## 2016-04-07 ENCOUNTER — Observation Stay (HOSPITAL_COMMUNITY): Payer: BLUE CROSS/BLUE SHIELD

## 2016-04-07 ENCOUNTER — Observation Stay (HOSPITAL_COMMUNITY): Payer: BLUE CROSS/BLUE SHIELD | Admitting: Certified Registered Nurse Anesthetist

## 2016-04-07 ENCOUNTER — Encounter (HOSPITAL_COMMUNITY)
Admission: EM | Disposition: A | Discharge status: Home / Self Care | Payer: Self-pay | Source: Home / Self Care | Attending: Emergency Medicine

## 2016-04-07 ENCOUNTER — Encounter (HOSPITAL_COMMUNITY): Payer: Self-pay | Admitting: *Deleted

## 2016-04-07 DIAGNOSIS — F329 Major depressive disorder, single episode, unspecified: Secondary | ICD-10-CM | POA: Diagnosis not present

## 2016-04-07 DIAGNOSIS — Z79899 Other long term (current) drug therapy: Secondary | ICD-10-CM | POA: Diagnosis not present

## 2016-04-07 DIAGNOSIS — Z90710 Acquired absence of both cervix and uterus: Secondary | ICD-10-CM | POA: Diagnosis not present

## 2016-04-07 DIAGNOSIS — D649 Anemia, unspecified: Secondary | ICD-10-CM | POA: Diagnosis not present

## 2016-04-07 DIAGNOSIS — K811 Chronic cholecystitis: Secondary | ICD-10-CM | POA: Diagnosis not present

## 2016-04-07 DIAGNOSIS — K8000 Calculus of gallbladder with acute cholecystitis without obstruction: Secondary | ICD-10-CM | POA: Diagnosis not present

## 2016-04-07 DIAGNOSIS — Z87891 Personal history of nicotine dependence: Secondary | ICD-10-CM | POA: Diagnosis not present

## 2016-04-07 DIAGNOSIS — Z885 Allergy status to narcotic agent status: Secondary | ICD-10-CM | POA: Diagnosis not present

## 2016-04-07 DIAGNOSIS — R1011 Right upper quadrant pain: Secondary | ICD-10-CM | POA: Diagnosis not present

## 2016-04-07 DIAGNOSIS — K810 Acute cholecystitis: Secondary | ICD-10-CM | POA: Diagnosis not present

## 2016-04-07 DIAGNOSIS — N809 Endometriosis, unspecified: Secondary | ICD-10-CM | POA: Diagnosis not present

## 2016-04-07 DIAGNOSIS — R109 Unspecified abdominal pain: Secondary | ICD-10-CM | POA: Diagnosis not present

## 2016-04-07 DIAGNOSIS — Z881 Allergy status to other antibiotic agents status: Secondary | ICD-10-CM | POA: Diagnosis not present

## 2016-04-07 DIAGNOSIS — Z87440 Personal history of urinary (tract) infections: Secondary | ICD-10-CM | POA: Diagnosis not present

## 2016-04-07 DIAGNOSIS — Z882 Allergy status to sulfonamides status: Secondary | ICD-10-CM | POA: Diagnosis not present

## 2016-04-07 DIAGNOSIS — N946 Dysmenorrhea, unspecified: Secondary | ICD-10-CM | POA: Diagnosis not present

## 2016-04-07 DIAGNOSIS — K812 Acute cholecystitis with chronic cholecystitis: Secondary | ICD-10-CM

## 2016-04-07 DIAGNOSIS — K219 Gastro-esophageal reflux disease without esophagitis: Secondary | ICD-10-CM | POA: Diagnosis not present

## 2016-04-07 HISTORY — PX: LAPAROSCOPIC CHOLECYSTECTOMY SINGLE SITE WITH INTRAOPERATIVE CHOLANGIOGRAM: SHX6538

## 2016-04-07 SURGERY — LAPAROSCOPIC CHOLECYSTECTOMY SINGLE SITE WITH INTRAOPERATIVE CHOLANGIOGRAM
Anesthesia: General | Site: Abdomen

## 2016-04-07 MED ORDER — SINCALIDE 5 MCG IJ SOLR: 2.0000 ug | Freq: Once | INTRAMUSCULAR | Status: DC

## 2016-04-07 MED ORDER — PROPOFOL 10 MG/ML IV BOLUS
INTRAVENOUS | Status: AC
  Filled 2016-04-07: qty 20

## 2016-04-07 MED ORDER — ONDANSETRON HCL 4 MG/2ML IJ SOLN
INTRAMUSCULAR | Status: AC
  Filled 2016-04-07: qty 2

## 2016-04-07 MED ORDER — MENTHOL 3 MG MT LOZG: 1.0000 | LOZENGE | OROMUCOSAL | Status: DC | PRN

## 2016-04-07 MED ORDER — FENTANYL CITRATE (PF) 250 MCG/5ML IJ SOLN
INTRAMUSCULAR | Status: AC
  Filled 2016-04-07: qty 5

## 2016-04-07 MED ORDER — TRAMADOL HCL 50 MG PO TABS
50.0000 mg | ORAL_TABLET | Freq: Four times a day (QID) | ORAL | Status: DC | PRN
  Administered 2016-04-07 – 2016-04-08 (×2): 100 mg via ORAL
  Filled 2016-04-07 (×2): qty 2

## 2016-04-07 MED ORDER — DIPHENHYDRAMINE HCL 50 MG/ML IJ SOLN: 12.5000 mg | Freq: Four times a day (QID) | INTRAMUSCULAR | Status: DC | PRN

## 2016-04-07 MED ORDER — MIDAZOLAM HCL 2 MG/2ML IJ SOLN
INTRAMUSCULAR | Status: AC
  Filled 2016-04-07: qty 2

## 2016-04-07 MED ORDER — FENTANYL CITRATE (PF) 100 MCG/2ML IJ SOLN
INTRAMUSCULAR | Status: AC
  Filled 2016-04-07: qty 2

## 2016-04-07 MED ORDER — PROMETHAZINE HCL 25 MG/ML IJ SOLN: 6.2500 mg | INTRAMUSCULAR | Status: DC | PRN

## 2016-04-07 MED ORDER — LACTATED RINGERS IR SOLN
Status: DC | PRN
  Administered 2016-04-07: 3000 mL

## 2016-04-07 MED ORDER — MIDAZOLAM HCL 5 MG/5ML IJ SOLN
INTRAMUSCULAR | Status: DC | PRN
  Administered 2016-04-07: 2 mg via INTRAVENOUS

## 2016-04-07 MED ORDER — IOPAMIDOL (ISOVUE-300) INJECTION 61%
INTRAVENOUS | Status: DC | PRN
  Administered 2016-04-07: 50 mL via INTRAVENOUS

## 2016-04-07 MED ORDER — DEXAMETHASONE SODIUM PHOSPHATE 10 MG/ML IJ SOLN
INTRAMUSCULAR | Status: DC | PRN
  Administered 2016-04-07: 10 mg via INTRAVENOUS

## 2016-04-07 MED ORDER — BUPIVACAINE HCL (PF) 0.25 % IJ SOLN
INTRAMUSCULAR | Status: DC | PRN
  Administered 2016-04-07: 60 mL

## 2016-04-07 MED ORDER — DIPHENHYDRAMINE HCL 25 MG PO CAPS: 25.0000 mg | ORAL_CAPSULE | Freq: Four times a day (QID) | ORAL | Status: DC | PRN

## 2016-04-07 MED ORDER — PROCHLORPERAZINE EDISYLATE 5 MG/ML IJ SOLN: 5.0000 mg | INTRAMUSCULAR | Status: DC | PRN

## 2016-04-07 MED ORDER — DEXTROSE 5 % IV SOLN
2.0000 g | INTRAVENOUS | Status: AC
  Administered 2016-04-07: 2 g via INTRAVENOUS
  Filled 2016-04-07 (×2): qty 2

## 2016-04-07 MED ORDER — VITAMIN C 500 MG PO TABS
500.0000 mg | ORAL_TABLET | Freq: Two times a day (BID) | ORAL | Status: DC
  Administered 2016-04-07 – 2016-04-09 (×4): 500 mg via ORAL
  Filled 2016-04-07 (×4): qty 1

## 2016-04-07 MED ORDER — 0.9 % SODIUM CHLORIDE (POUR BTL) OPTIME
TOPICAL | Status: DC | PRN
  Administered 2016-04-07: 1000 mL

## 2016-04-07 MED ORDER — PROMETHAZINE HCL 25 MG/ML IJ SOLN
INTRAMUSCULAR | Status: AC
  Filled 2016-04-07: qty 1

## 2016-04-07 MED ORDER — SUCCINYLCHOLINE CHLORIDE 200 MG/10ML IV SOSY
PREFILLED_SYRINGE | INTRAVENOUS | Status: DC | PRN
  Administered 2016-04-07: 130 mg via INTRAVENOUS

## 2016-04-07 MED ORDER — TRAMADOL HCL 50 MG PO TABS: 50.0000 mg | ORAL_TABLET | Freq: Four times a day (QID) | ORAL | 0 refills | Status: DC | PRN

## 2016-04-07 MED ORDER — DEXAMETHASONE SODIUM PHOSPHATE 10 MG/ML IJ SOLN
INTRAMUSCULAR | Status: AC
  Filled 2016-04-07: qty 1

## 2016-04-07 MED ORDER — TECHNETIUM TC 99M MEBROFENIN IV KIT
5.0000 | PACK | Freq: Once | INTRAVENOUS | Status: AC | PRN
  Administered 2016-04-07: 5 via INTRAVENOUS

## 2016-04-07 MED ORDER — SODIUM CHLORIDE 0.9 % IV SOLN: 250.0000 mL | INTRAVENOUS | Status: DC | PRN

## 2016-04-07 MED ORDER — LACTATED RINGERS IV SOLN
INTRAVENOUS | Status: DC
  Administered 2016-04-07: 16:00:00 via INTRAVENOUS

## 2016-04-07 MED ORDER — BUPIVACAINE HCL (PF) 0.25 % IJ SOLN
INTRAMUSCULAR | Status: AC
  Filled 2016-04-07: qty 60

## 2016-04-07 MED ORDER — LACTATED RINGERS IV SOLN
INTRAVENOUS | Status: DC | PRN
  Administered 2016-04-07 (×2): via INTRAVENOUS

## 2016-04-07 MED ORDER — FENTANYL CITRATE (PF) 100 MCG/2ML IJ SOLN
25.0000 ug | INTRAMUSCULAR | Status: DC | PRN
  Administered 2016-04-07 (×2): 25 ug via INTRAVENOUS
  Administered 2016-04-07 (×2): 50 ug via INTRAVENOUS
  Administered 2016-04-07: 25 ug via INTRAVENOUS

## 2016-04-07 MED ORDER — HYDROMORPHONE HCL 1 MG/ML IJ SOLN
INTRAMUSCULAR | Status: DC | PRN
  Administered 2016-04-07: 1 mg via INTRAVENOUS

## 2016-04-07 MED ORDER — ROCURONIUM BROMIDE 50 MG/5ML IV SOSY
PREFILLED_SYRINGE | INTRAVENOUS | Status: AC
  Filled 2016-04-07: qty 5

## 2016-04-07 MED ORDER — HYDROMORPHONE HCL 2 MG/ML IJ SOLN
INTRAMUSCULAR | Status: AC
  Filled 2016-04-07: qty 1

## 2016-04-07 MED ORDER — SUGAMMADEX SODIUM 200 MG/2ML IV SOLN
INTRAVENOUS | Status: AC
  Filled 2016-04-07: qty 2

## 2016-04-07 MED ORDER — SUCCINYLCHOLINE CHLORIDE 200 MG/10ML IV SOSY
PREFILLED_SYRINGE | INTRAVENOUS | Status: AC
  Filled 2016-04-07: qty 10

## 2016-04-07 MED ORDER — METHOCARBAMOL 1000 MG/10ML IJ SOLN
1000.0000 mg | Freq: Four times a day (QID) | INTRAMUSCULAR | Status: DC | PRN
  Filled 2016-04-07: qty 10

## 2016-04-07 MED ORDER — ESTRADIOL 0.1 MG/GM VA CREA
3.0000 g | TOPICAL_CREAM | VAGINAL | Status: DC
  Filled 2016-04-07: qty 42.5

## 2016-04-07 MED ORDER — LORAZEPAM 2 MG/ML IJ SOLN: 0.5000 mg | Freq: Three times a day (TID) | INTRAMUSCULAR | Status: DC | PRN

## 2016-04-07 MED ORDER — LACTATED RINGERS IV BOLUS (SEPSIS): 1000.0000 mL | Freq: Three times a day (TID) | INTRAVENOUS | Status: DC | PRN

## 2016-04-07 MED ORDER — LIDOCAINE 2% (20 MG/ML) 5 ML SYRINGE
INTRAMUSCULAR | Status: DC | PRN
  Administered 2016-04-07: 100 mg via INTRAVENOUS

## 2016-04-07 MED ORDER — ALPRAZOLAM 0.5 MG PO TABS
0.5000 mg | ORAL_TABLET | Freq: Every evening | ORAL | Status: DC | PRN
  Administered 2016-04-07: 0.5 mg via ORAL
  Filled 2016-04-07: qty 1

## 2016-04-07 MED ORDER — MAGIC MOUTHWASH
15.0000 mL | Freq: Four times a day (QID) | ORAL | Status: DC | PRN
  Filled 2016-04-07: qty 15

## 2016-04-07 MED ORDER — SUGAMMADEX SODIUM 200 MG/2ML IV SOLN
INTRAVENOUS | Status: DC | PRN
  Administered 2016-04-07: 200 mg via INTRAVENOUS

## 2016-04-07 MED ORDER — ADULT MULTIVITAMIN W/MINERALS CH
1.0000 | ORAL_TABLET | Freq: Every day | ORAL | Status: DC
  Administered 2016-04-08 – 2016-04-09 (×2): 1 via ORAL
  Filled 2016-04-07 (×2): qty 1

## 2016-04-07 MED ORDER — BISACODYL 10 MG RE SUPP
10.0000 mg | Freq: Two times a day (BID) | RECTAL | Status: DC | PRN
Start: 2016-04-07 — End: 2016-04-09

## 2016-04-07 MED ORDER — IOPAMIDOL (ISOVUE-300) INJECTION 61%
INTRAVENOUS | Status: AC
  Filled 2016-04-07: qty 50

## 2016-04-07 MED ORDER — LIDOCAINE 2% (20 MG/ML) 5 ML SYRINGE
INTRAMUSCULAR | Status: AC
  Filled 2016-04-07: qty 5

## 2016-04-07 MED ORDER — SODIUM CHLORIDE 0.9% FLUSH: 3.0000 mL | INTRAVENOUS | Status: DC | PRN

## 2016-04-07 MED ORDER — BUPIVACAINE HCL (PF) 0.25 % IJ SOLN
INTRAMUSCULAR | Status: AC
Start: 2016-04-07 — End: 2016-04-07
  Filled 2016-04-07: qty 30

## 2016-04-07 MED ORDER — PROPOFOL 10 MG/ML IV BOLUS
INTRAVENOUS | Status: DC | PRN
  Administered 2016-04-07: 20 mg via INTRAVENOUS
  Administered 2016-04-07: 180 mg via INTRAVENOUS

## 2016-04-07 MED ORDER — SERTRALINE HCL 100 MG PO TABS
100.0000 mg | ORAL_TABLET | Freq: Every day | ORAL | Status: DC
  Administered 2016-04-07 – 2016-04-08 (×2): 100 mg via ORAL
  Filled 2016-04-07 (×2): qty 1

## 2016-04-07 MED ORDER — FENTANYL CITRATE (PF) 100 MCG/2ML IJ SOLN
INTRAMUSCULAR | Status: DC | PRN
  Administered 2016-04-07 (×5): 50 ug via INTRAVENOUS

## 2016-04-07 MED ORDER — SODIUM CHLORIDE 0.9% FLUSH
3.0000 mL | Freq: Two times a day (BID) | INTRAVENOUS | Status: DC
  Administered 2016-04-08 (×2): 3 mL via INTRAVENOUS

## 2016-04-07 MED ORDER — NAPROXEN 500 MG PO TABS
500.0000 mg | ORAL_TABLET | Freq: Two times a day (BID) | ORAL | Status: DC
  Filled 2016-04-07 (×2): qty 1

## 2016-04-07 MED ORDER — PHENOL 1.4 % MT LIQD
2.0000 | OROMUCOSAL | Status: DC | PRN
  Filled 2016-04-07: qty 177

## 2016-04-07 MED ORDER — ONDANSETRON HCL 4 MG/2ML IJ SOLN
INTRAMUSCULAR | Status: DC | PRN
  Administered 2016-04-07: 4 mg via INTRAVENOUS

## 2016-04-07 MED ORDER — METOCLOPRAMIDE HCL 5 MG/ML IJ SOLN: 5.0000 mg | Freq: Four times a day (QID) | INTRAMUSCULAR | Status: DC | PRN

## 2016-04-07 MED ORDER — NAPROXEN 500 MG PO TABS: 500.0000 mg | ORAL_TABLET | Freq: Two times a day (BID) | ORAL | 1 refills | Status: DC | PRN

## 2016-04-07 MED ORDER — METHOCARBAMOL 500 MG PO TABS: 1000.0000 mg | ORAL_TABLET | Freq: Four times a day (QID) | ORAL | Status: DC | PRN

## 2016-04-07 MED ORDER — ROCURONIUM BROMIDE 10 MG/ML (PF) SYRINGE
PREFILLED_SYRINGE | INTRAVENOUS | Status: DC | PRN
  Administered 2016-04-07: 10 mg via INTRAVENOUS
  Administered 2016-04-07: 40 mg via INTRAVENOUS

## 2016-04-07 MED ORDER — ALUM & MAG HYDROXIDE-SIMETH 200-200-20 MG/5ML PO SUSP: 30.0000 mL | Freq: Four times a day (QID) | ORAL | Status: DC | PRN

## 2016-04-07 MED ORDER — LIP MEDEX EX OINT
1.0000 "application " | TOPICAL_OINTMENT | Freq: Two times a day (BID) | CUTANEOUS | Status: DC
  Administered 2016-04-08: 1 via TOPICAL

## 2016-04-07 MED ORDER — LACTATED RINGERS IV SOLN
INTRAVENOUS | Status: DC
  Administered 2016-04-07: 100 mL/h via INTRAVENOUS

## 2016-04-07 SURGICAL SUPPLY — 40 items
APPLIER CLIP 5 13 M/L LIGAMAX5 (MISCELLANEOUS) ×2
CABLE HIGH FREQUENCY MONO STRZ (ELECTRODE) ×2 IMPLANT
CHLORAPREP W/TINT 26ML (MISCELLANEOUS) ×2 IMPLANT
CLIP APPLIE 5 13 M/L LIGAMAX5 (MISCELLANEOUS) ×1 IMPLANT
COVER MAYO STAND STRL (DRAPES) ×2 IMPLANT
COVER SURGICAL LIGHT HANDLE (MISCELLANEOUS) IMPLANT
DECANTER SPIKE VIAL GLASS SM (MISCELLANEOUS) IMPLANT
DRAIN CHANNEL 19F RND (DRAIN) IMPLANT
DRAPE C-ARM 42X120 X-RAY (DRAPES) ×2 IMPLANT
DRAPE WARM FLUID 44X44 (DRAPE) ×2 IMPLANT
DRSG TEGADERM 4X4.75 (GAUZE/BANDAGES/DRESSINGS) ×2 IMPLANT
ELECT REM PT RETURN 9FT ADLT (ELECTROSURGICAL) ×2
ELECTRODE REM PT RTRN 9FT ADLT (ELECTROSURGICAL) ×1 IMPLANT
ENDOLOOP SUT PDS II  0 18 (SUTURE) ×1
ENDOLOOP SUT PDS II 0 18 (SUTURE) ×1 IMPLANT
EVACUATOR SILICONE 100CC (DRAIN) IMPLANT
GAUZE SPONGE 2X2 8PLY STRL LF (GAUZE/BANDAGES/DRESSINGS) ×1 IMPLANT
GLOVE ECLIPSE 8.0 STRL XLNG CF (GLOVE) ×2 IMPLANT
GLOVE INDICATOR 8.0 STRL GRN (GLOVE) ×2 IMPLANT
GOWN STRL REUS W/TWL XL LVL3 (GOWN DISPOSABLE) ×8 IMPLANT
IRRIG SUCT STRYKERFLOW 2 WTIP (MISCELLANEOUS)
IRRIGATION SUCT STRKRFLW 2 WTP (MISCELLANEOUS) IMPLANT
KIT BASIN OR (CUSTOM PROCEDURE TRAY) ×2 IMPLANT
PAD POSITIONING PINK XL (MISCELLANEOUS) ×2 IMPLANT
POSITIONER SURGICAL ARM (MISCELLANEOUS) ×2 IMPLANT
POUCH SPECIMEN RETRIEVAL 10MM (ENDOMECHANICALS) ×2 IMPLANT
SCISSORS LAP 5X35 DISP (ENDOMECHANICALS) ×2 IMPLANT
SET CHOLANGIOGRAPH MIX (MISCELLANEOUS) ×2 IMPLANT
SET IRRIG TUBING LAPAROSCOPIC (IRRIGATION / IRRIGATOR) ×2 IMPLANT
SHEARS HARMONIC ACE PLUS 36CM (ENDOMECHANICALS) ×2 IMPLANT
SPONGE GAUZE 2X2 STER 10/PKG (GAUZE/BANDAGES/DRESSINGS) ×1
SUT MNCRL AB 4-0 PS2 18 (SUTURE) ×2 IMPLANT
SUT PDS AB 1 CT1 27 (SUTURE) ×4 IMPLANT
SYR 20CC LL (SYRINGE) ×2 IMPLANT
TOWEL OR 17X26 10 PK STRL BLUE (TOWEL DISPOSABLE) ×2 IMPLANT
TOWEL OR NON WOVEN STRL DISP B (DISPOSABLE) ×2 IMPLANT
TRAY LAPAROSCOPIC (CUSTOM PROCEDURE TRAY) ×2 IMPLANT
TROCAR BLADELESS OPT 5 100 (ENDOMECHANICALS) ×2 IMPLANT
TROCAR BLADELESS OPT 5 150 (ENDOMECHANICALS) ×2 IMPLANT
TUBING INSUF HEATED (TUBING) ×2 IMPLANT

## 2016-04-07 NOTE — Op Note (Signed)
04/07/2016  7:19 PM  PATIENT:  Kaitlin Jenkins  46 y.o. female  No care team member to display  PRE-OPERATIVE DIAGNOSIS:  Biliary dyskinesia with chronic acalculous cholecystitis  POST-OPERATIVE DIAGNOSIS:  acute on chronic acalculus cholecystitis  PROCEDURE:   LAPAROSCOPIC CHOLECYSTECTOMY SINGLE SITE   SURGEON:  Ardeth Sportsman, MD  ASSISTANT: Flint Melter, PA-S, [employer]   ANESTHESIA:   local and general  EBL:  Total I/O In: 700 [I.V.:700] Out: -   Delay start of Pharmacological VTE agent (>24hrs) due to surgical blood loss or risk of bleeding:  no  DRAINS:  none   SPECIMEN:  Gallbladder   DISPOSITION OF SPECIMEN:  PATHOLOGY  COUNTS:  YES  PLAN OF CARE: Admit for overnight observation  PATIENT DISPOSITION:  PACU - hemodynamically stable.  INDICATION: 46 year old female with episodes of postprandial abdominal pain and nausea suspicious for gallbladder etiology.  Came in with some gallbladder wall inflammation but no stones.  HIDA scan shows filling gallbladder arguing against acute cholecystitis but decreased gallbladder ejection fraction with reproduction of symptoms suspicious for at least chronic acalculous cholecystitis.  I recommended cholecystectomy:  The anatomy & physiology of hepatobiliary & pancreatic function was discussed.  The pathophysiology of gallbladder dysfunction was discussed.  Natural history risks without surgery was discussed.   I feel the risks of no intervention will lead to serious problems that outweigh the operative risks; therefore, I recommended cholecystectomy to remove the pathology.  I explained laparoscopic techniques with possible need for an open approach.  Probable cholangiogram to evaluate the bilary tract was explained as well.    Risks such as bleeding, infection, abscess, leak, injury to other organs, need for further treatment, heart attack, death, and other risks were discussed.  I noted a good likelihood this will help  address the problem.  Possibility that this will not correct all abdominal symptoms was explained.  Goals of post-operative recovery were discussed as well.  We will work to minimize complications.  An educational handout further explaining the pathology and treatment options was given as well.  Questions were answered.  The patient expresses understanding & wishes to proceed with surgery.  OR FINDINGS: Moderate lesions to a mildly edematous thickened gallbladder consistent with acute on chronic cholecystitis.  Very narrow and short cystic duct.  No distal common bile duct or ampullary or duodenal adhesions or strictures.  Hard to get seal with the cholangiogram catheter with leak.  Excellent critical view and ligation of cystic duct stump.  No evidence of any common bile duct injury.  Liver normal with no fatty change and not  inflamed.  No other intra-abdominal pathology of note.  DESCRIPTION:   The patient was identified & brought in the operating room. The patient was positioned supine with arms tucked. SCDs were active during the entire case. The patient underwent general anesthesia without any difficulty.  The abdomen was prepped and draped in a sterile fashion. A Surgical Timeout confirmed our plan.  I made a transverse curvilinear incision through the superior umbilical fold.  I placed a 5mm long port through the supraumbilical fascia using a modified Hassan cutdown technique. I began carbon dioxide insufflation. Camera inspection revealed no injury. There were no adhesions to the anterior abdominal wall supraumbilically.  I proceeded to continue with single site technique. I placed a #5 port in left upper aspect of the wound. I placed a 5 mm atraumatic grasper in the right inferior aspect of the wound.  I turned attention to the  right upper quadrant.   patient had moderate lesions of the gallbladder cannot be seen well.  I freed greater omental and mesocolon adhesions off the gallbladder to  better expose it. The gallbladder fundus was elevated cephalad.   I freed up more adhesions off the ventral surface of the gallbladder.  It was mildly thickened and somewhat edematous suspicious for acute on chronic cholecystitis.  I freed the peritoneal coverings between the gallbladder and the liver on the posteriolateral and anteriomedial walls. I alternated between Harmonic & blunt Maryland dissection to help get a good critical view of the cystic artery and cystic duct. I did further dissection to free a few centimeters of the  gallbladder off the liver bed to get a good critical view of the infundibulum and cystic duct. I mobilized the cystic artery; and, after getting a good 360 view, ligated the cystic artery using the Harmonic ultrasonic dissection. I skeletonized the cystic duct to make sure that I had a complete 360 view.  I skeletonized well and was coming down the common bile duct.  It was rather narrow, consistent with probable partial cystic duct obstruction given a very narrowed biliary duct.    I placed a clip on the infundibulum. I did a partial cystic duct-otomy and ensured patency. I placed a 5 [place] cholangiocatheter through a puncture site at the right subcostal ridge of the abdominal wall and directed it into the cystic duct.  this was a challenge is a cystic duct was somewhat short and narrowed.  It was very shortened toe is hard to get a good seal around the catheter.  However it flowed well into the cystic duct and observation with no evidence of leak of saline around it under direct visualization.  We ran a cholangiogram with dilute radio-opaque contrast and continuous fluoroscopy.  Initial run showed leaking.  I reinspected and indeed the catheter had popped back out.  Therefore trimmed the tip to get a better edge with no kinking.  I was able to advance it into the short cystic duct.  I redid cholangiogram.  showed flow down the common bile duct across normal ampulla into the  duodenum.  Was leaking at the cystic duct stump, so could not get good reflux up the common hepatic and intrahepatic chains.  I removed the cholangiocatheter.  The infundibulum avulsed off the cystic duct leaving just the stump.  This able to elevate that cystic duct stump up well and get a good ligation on it using 0 PDS Endoloop flush with the cystic/common bile duct junction.  I placed a few clips at that level.  Placed clips at the lymph node of [place] and anterior and posterior cystic artery branch stumps.  I did copious irrigation in the right upper quadrant in serial aliquots.  I ensured hemostasis.  I placed a gallbladder Endo Catch bag.  I did further irrigation.  Meticulous inspection of the liver bed in the porta hepatis.  Clips intact.  No leak of bile or blood.  No evidence of any bile leak or injury.  I removed the gallbladder out the supraumbilical fascia. I closed the fascia transversely using #1 PDS interrupted stitches. I closed the skin using 4-0 monocryl stitch.  Sterile dressing was applied. The patient was extubated & arrived in the PACU in stable condition..  I had discussed postoperative care with the patient in the holding area. I discussed operative findings, updated the patient's status, discussed probable steps to recovery, and gave postoperative recommendations to the  Patient's husband and mother..  Recommendations were made.  Questions were answered.  They expressed understanding & appreciation.  Instructions are written in the chart as well.  Ardeth SportsmanSteven C. Sydna Brodowski, M.D., F.A.C.S. Gastrointestinal and Minimally Invasive Surgery [facility] [address], [address] [place], [place] [id number] [phone] Main / Paging

## 2016-04-07 NOTE — Progress Notes (Signed)
[facility] Progress Note     Subjective: C/o constant dull pain over her epigastrium and right hemi-abdomen with intermittent colicky RUQ pain that radiates to her RLQ and back. Denies vomiting. Discussed HIDA scan again with pt. Pt states she received pain meds at 0445 so her HIDA was delayed.  Objective: Vital signs in last 24 hours: Temp:  [97.7 F (36.5 C)-98.1 F (36.7 C)] 97.7 F (36.5 C) (01/12 0500) Pulse Rate:  [56-82] 59 (01/12 0500) Resp:  [16-20] 16 (01/12 0500) BP: (96-120)/(56-79) 96/62 (01/12 0500) SpO2:  [95 %-100 %] 98 % (01/12 0500) Weight:  [101.6 kg (224 lb)] 101.6 kg (224 lb) (01/11 1126) Last BM Date: 04/05/16  Intake/Output from previous day: 01/11 0701 - 01/12 0700 In: 923.3 [I.V.:923.3] Out: 226 [Urine:226] Intake/Output this shift: No intake/output data recorded.  PE: Gen:  Alert, NAD, pleasant Pulm: unlabored  Abd: Soft, TTP upper abdomen and RUQ without guarding or peritonitis, ND, +BS, +abdominal striae   Lab Results:   Recent Labs  04/06/16 1205  WBC 6.5  HGB 13.3  HCT 39.4  PLT 264   BMET  Recent Labs  04/06/16 1205  NA 139  K 4.1  CL 104  CO2 27  GLUCOSE 74  BUN 15  CREATININE 0.90  CALCIUM 9.4   CMP     Component Value Date/Time   NA 139 04/06/2016 1205   K 4.1 04/06/2016 1205   CL 104 04/06/2016 1205   CO2 27 04/06/2016 1205   GLUCOSE 74 04/06/2016 1205   BUN 15 04/06/2016 1205   CREATININE 0.90 04/06/2016 1205   CALCIUM 9.4 04/06/2016 1205   PROT 7.3 04/06/2016 1205   ALBUMIN 4.1 04/06/2016 1205   AST 20 04/06/2016 1205   ALT 17 04/06/2016 1205   ALKPHOS 87 04/06/2016 1205   BILITOT 0.4 04/06/2016 1205   GFRNONAA >60 04/06/2016 1205   GFRAA >60 04/06/2016 1205   Lipase     Component Value Date/Time   LIPASE 21 04/06/2016 1205   Studies/Results: [place] Abdomen Limited Ruq  Result Date: 04/06/2016 CLINICAL DATA:  Four days of right upper quadrant epigastric pain especially postprandially.  EXAM: US ABDOMEN LIMITED - RIGHT UPPER QUADRANT COMPARISON:  None in PACs FINDINGS: Gallbladder: The gallbladder is adequately distended. There is minimal wall thickening focally along the anterior aspect of the gallbladder. There is no evidence of gallbladder polyps, stones, or sludge. There is a positive sonographic Murphy's sign. Common bile duct: Diameter: 1.8 mm Liver: The hepatic echotexture is normal. There is no focal mass or ductal dilation. IMPRESSION: Focal gallbladder wall thickening and positive sonographic Murphy's sign. No stones or sludge are observed. The findings could reflect acute cholecystitis in the appropriate clinical setting. Normal appearing liver and common bile duct. Electronically Signed   By: David  SwazilandJordan M.D.   On: 04/06/2016 13:08    Anti-infectives: Anti-infectives    Start     Dose/Rate Route Frequency Ordered Stop   04/06/16 1400  cefTRIAXone (ROCEPHIN) 2 g in dextrose 5 % 50 mL IVPB     2 g 100 mL/hr over 30 Minutes Intravenous  Once 04/06/16 1357 04/06/16 1442     Assessment/Plan RUQ abdominal pain, rule out acalculous cholecystitis  - RUQ U/S: distended GB with mild GB wall thickening, no stones  - WBC, LFT's WNL - HIDA w/ EF pending  - continue NPO, IVF   LOS: 0 days    Adam PhenixElizabeth S Simaan , [facility] 04/07/2016, 9:34 AM Pager:  [phone] Consults: [phone] Mon-Fri 7:00 am-4:30 pm Sat-Sun 7:00 am-11:30 am

## 2016-04-07 NOTE — Transfer of Care (Signed)
Immediate Anesthesia Transfer of Care Note  Patient: Kaitlin Jenkins  Procedure(s) Performed: Procedure(s): LAPAROSCOPIC CHOLECYSTECTOMY SINGLE SITE WITH INTRAOPERATIVE CHOLANGIOGRAM (N/A)  Patient Location: PACU  Anesthesia Type:General  Level of Consciousness: awake, alert  and oriented  Airway & Oxygen Therapy: Patient Spontanous Breathing and Patient connected to face mask oxygen  Post-op Assessment: Report given to RN and Post -op Vital signs reviewed and stable  Post vital signs: Reviewed and stable  Last Vitals:  Vitals:   04/07/16 1350 04/07/16 1916  BP: 104/71   Pulse: 75 95  Resp:    Temp: 37.1 C     Last Pain:  Vitals:   04/07/16 1433  TempSrc:   PainSc: 3       Patients Stated Pain Goal: 2 (04/07/16 1403)  Complications: No apparent anesthesia complications

## 2016-04-07 NOTE — Anesthesia Postprocedure Evaluation (Signed)
Anesthesia Post Note  Patient: Kaitlin Jenkins  Procedure(s) Performed: Procedure(s) (LRB): LAPAROSCOPIC CHOLECYSTECTOMY SINGLE SITE WITH INTRAOPERATIVE CHOLANGIOGRAM (N/A)  Patient location during evaluation: PACU Anesthesia Type: General Level of consciousness: awake Pain management: pain level controlled Respiratory status: spontaneous breathing Cardiovascular status: stable Postop Assessment: no headache Anesthetic complications: no       Last Vitals:  Vitals:   04/07/16 1930 04/07/16 1932  BP: 126/79 124/76  Pulse: 87 90  Resp: 15 14  Temp:      Last Pain:  Vitals:   04/07/16 1930  TempSrc:   PainSc: Asleep                 Mikela Senn

## 2016-04-07 NOTE — Interval H&P Note (Signed)
History and Physical Interval Note:  04/07/2016 5:07 PM  Kaitlin Jenkins  has presented today for surgery, with the diagnosis of choleliathisis  The various methods of treatment have been discussed with the patient and family. After consideration of risks, benefits and other options for treatment, the patient has consented to  Procedure(s): LAPAROSCOPIC CHOLECYSTECTOMY SINGLE SITE WITH INTRAOPERATIVE CHOLANGIOGRAM (N/A) as a surgical intervention .  The patient's history has been reviewed, patient examined, no change in status, stable for surgery.  I have reviewed the patient's chart and labs.  Questions were answered to the patient's satisfaction.     Veralyn Lopp C.

## 2016-04-07 NOTE — Anesthesia Preprocedure Evaluation (Signed)
Anesthesia Evaluation  Patient identified by MRN, date of birth, ID band Patient awake    Reviewed: Allergy & Precautions, NPO status , Patient's Chart, lab work & pertinent test results  Airway Mallampati: II  TM Distance: >3 FB     Dental   Pulmonary former smoker,    breath sounds clear to auscultation       Cardiovascular negative cardio ROS   Rhythm:Regular Rate:Normal     Neuro/Psych    GI/Hepatic Neg liver ROS, GERD  ,  Endo/Other    Renal/GU negative Renal ROS     Musculoskeletal   Abdominal   Peds  Hematology  (+) anemia ,   Anesthesia Other Findings   Reproductive/Obstetrics                             Anesthesia Physical Anesthesia Plan  ASA: III  Anesthesia Plan: General   Post-op Pain Management:    Induction: Intravenous  Airway Management Planned: Oral ETT  Additional Equipment:   Intra-op Plan:   Post-operative Plan: Extubation in OR  Informed Consent: I have reviewed the patients History and Physical, chart, labs and discussed the procedure including the risks, benefits and alternatives for the proposed anesthesia with the patient or authorized representative who has indicated his/her understanding and acceptance.   Dental advisory given  Plan Discussed with: CRNA and Anesthesiologist  Anesthesia Plan Comments:         Anesthesia Quick Evaluation

## 2016-04-07 NOTE — H&P (View-Only) (Signed)
Kaitlin Jenkins E Zappulla  [birth year] [id number]  No care team member to display  This patient is a 46 y.o.female   Called by [facility] [place].  Emergency room physician and handed phone to physician Asst. [name].  Consult requested for patient who has abdominal pain and probable Murphy's sign.  Ultrasound suspicious for cholecystitis.  I recommended [facility].  I recommend they called [facility] to help set up transportation.  Consult surgery when they arrive for consultation and see if appropriate for admission and cholecystectomy versus need of medical stabilization by inpatient hospitalist service first.     There are no active problems to display for this patient.   Past Medical History:  Diagnosis Date  . Anemia    Mild normocytic anemia  . Bacterial vaginosis   . Dysmenorrhea   . Endometriosis    history of endometriosis  . Gastroesophageal reflux disease   . H/O tubal ligation   . History of depression   . History of frequent urinary tract infections   . Pelvic pain   . Pyelonephritis    Right pyelonephritis  . Sepsis(995.91)     Past Surgical History:  Procedure Laterality Date  . ABDOMINAL HYSTERECTOMY    . CESAREAN SECTION    . OTHER SURGICAL HISTORY     Right breast cyst removal--Right ovarian cyst excision  . VAGINAL HYSTERECTOMY     with left salpingo-oophorectomy    Social History   Social History  . Marital status: Married    Spouse name: N/A  . Number of children: N/A  . Years of education: N/A   Occupational History  . Not on file.   Social History Main Topics  . Smoking status: Former Games developermoker  . Smokeless tobacco: Never Used  . Alcohol use No  . Drug use: No  . Sexual activity: Not on file   Other Topics Concern  . Not on file   Social History Narrative  . No narrative on file    No family history on file.  No current facility-administered medications for this encounter.    Current  Outpatient Prescriptions  Medication Sig Dispense Refill  . ALPRAZolam (XANAX) 0.5 MG tablet Take 0.5 mg by mouth at bedtime as needed for sleep.     Marland Kitchen. CALCIUM PO Take 1 tablet by mouth daily.    . Cholecalciferol (VITAMIN D3) 5000 units CAPS Take 10,000 Units by mouth daily.    Marland Kitchen. estradiol (ESTRACE) 0.1 MG/GM vaginal cream Place 3 g vaginally once a week. wednesdays    . Multiple Vitamins-Minerals (MULTIVITAMIN WITH MINERALS) tablet Take 1 tablet by mouth daily.    . Probiotic Product (PROBIOTIC PO) Take 1 capsule by mouth daily.    . sertraline (ZOLOFT) 100 MG tablet Take 100 mg by mouth at bedtime.   2     Allergies  Allergen Reactions  . Morphine And Related Nausea And Vomiting  . Ciprofloxacin Swelling  . Sulfa Drugs Cross Reactors Other (See Comments)    Blood will not clot, runs very thin; bruises    BP 112/74 (BP Location: Right Arm)   Pulse 73   Temp 98.1 F (36.7 C) (Oral)   Resp 18   Ht 5\' 7"  (1.702 m)   Wt 101.6 kg (224 lb)   SpO2 98%   BMI 35.08 kg/m   Dg Elbow Complete Right  Result Date: 03/22/2016 CLINICAL DATA:  Fall. EXAM: RIGHT ELBOW - COMPLETE 3+ VIEW COMPARISON:  None.  FINDINGS: There is no evidence of fracture, dislocation, or joint effusion. There is no evidence of arthropathy or other focal bone abnormality. Soft tissues are unremarkable. IMPRESSION: Negative. Electronically Signed   By: Signa Kell M.D.   On: 03/22/2016 11:54   US Abdomen Limited Ruq  Result Date: 04/06/2016 CLINICAL DATA:  Four days of right upper quadrant epigastric pain especially postprandially. EXAM: US ABDOMEN LIMITED - RIGHT UPPER QUADRANT COMPARISON:  None in PACs FINDINGS: Gallbladder: The gallbladder is adequately distended. There is minimal wall thickening focally along the anterior aspect of the gallbladder. There is no evidence of gallbladder polyps, stones, or sludge. There is a positive sonographic Murphy's sign. Common bile duct: Diameter: 1.8 mm Liver: The hepatic  echotexture is normal. There is no focal mass or ductal dilation. IMPRESSION: Focal gallbladder wall thickening and positive sonographic Murphy's sign. No stones or sludge are observed. The findings could reflect acute cholecystitis in the appropriate clinical setting. Normal appearing liver and common bile duct. Electronically Signed   By: David  [place]iland M.D.   On: 04/06/2016 13:08    Note: This dictation was prepared with Dragon/digital dictation along with [employer]. Any transcriptional errors that result from this process are unintentional.   .Ardeth Sportsman, M.D., F.A.C.S. Gastrointestinal and Minimally Invasive Surgery [facility] [address], [address] [place], [place] [address] [phone] Main / Paging  04/06/2016 6:26 PM

## 2016-04-07 NOTE — Anesthesia Procedure Notes (Signed)
Procedure Name: Intubation Date/Time: 04/07/2016 5:22 PM Performed by: Montel Clock Pre-anesthesia Checklist: Patient identified, Emergency Drugs available, Suction available, Patient being monitored and Timeout performed Patient Re-evaluated:Patient Re-evaluated prior to inductionOxygen Delivery Method: Circle system utilized Preoxygenation: Pre-oxygenation with 100% oxygen Intubation Type: IV induction, Rapid sequence and Cricoid Pressure applied Laryngoscope Size: Mac and 3 Grade View: Grade I Tube type: Oral Tube size: 7.5 mm Number of attempts: 1 Airway Equipment and Method: Stylet Placement Confirmation: ETT inserted through vocal cords under direct vision,  positive ETCO2 and breath sounds checked- equal and bilateral Secured at: 21 cm Tube secured with: Tape Dental Injury: Teeth and Oropharynx as per pre-operative assessment

## 2016-04-07 NOTE — Discharge Instructions (Signed)
LAPAROSCOPIC SURGERY: POST OP INSTRUCTIONS  ######################################################################  EAT Gradually transition to a high fiber diet with a fiber supplement over the next few weeks after discharge.  Start with a pureed / full liquid diet (see below)  WALK Walk an hour a day.  Control your pain to do that.    CONTROL PAIN Control pain so that you can walk, sleep, tolerate sneezing/coughing, go up/down stairs.  HAVE A BOWEL MOVEMENT DAILY Keep your bowels regular to avoid problems.  OK to try a laxative to override constipation.  OK to use an antidairrheal to slow down diarrhea.  Call if not better after 2 tries  CALL IF YOU HAVE PROBLEMS/CONCERNS Call if you are still struggling despite following these instructions. Call if you have concerns not answered by these instructions  ######################################################################    1. DIET: Follow a light bland diet the first 24 hours after arrival home, such as soup, liquids, crackers, etc.  Be sure to include lots of fluids daily.  Avoid fast food or heavy meals as your are more likely to get nauseated.  Eat a low fat the next few days after surgery.   2. Take your usually prescribed home medications unless otherwise directed. 3. PAIN CONTROL: a. Pain is best controlled by a usual combination of three different methods TOGETHER: i. Ice/Heat ii. Over the counter pain medication iii. Prescription pain medication b. Most patients will experience some swelling and bruising around the incisions.  Ice packs or heating pads (30-60 minutes up to 6 times a day) will help. Use ice for the first few days to help decrease swelling and bruising, then switch to heat to help relax tight/sore spots and speed recovery.  Some people prefer to use ice alone, heat alone, alternating between ice & heat.  Experiment to what works for you.  Swelling and bruising can take several weeks to resolve.   c. It is  helpful to take an over-the-counter pain medication regularly for the first few weeks.  Choose one of the following that works best for you: i. Naproxen (Aleve, etc)  Two 251m tabs twice a day ii. Ibuprofen (Advil, etc) Three 2053mtabs four times a day (every meal & bedtime) iii. Acetaminophen (Tylenol, etc) 500-65044mour times a day (every meal & bedtime) d. A  prescription for pain medication (such as oxycodone, hydrocodone, etc) should be given to you upon discharge.  Take your pain medication as prescribed.  i. If you are having problems/concerns with the prescription medicine (does not control pain, nausea, vomiting, rash, itching, etc), please call us [phone] see if we need to switch you to a different pain medicine that will work better for you and/or control your side effect better. ii. If you need a refill on your pain medication, please contact your pharmacy.  They will contact our office to request authorization. Prescriptions will not be filled after 5 pm or on week-ends. 4. Avoid getting constipated.  Between the surgery and the pain medications, it is common to experience some constipation.  Increasing fluid intake and taking a fiber supplement (such as Metamucil, Citrucel, FiberCon, MiraLax, etc) 1-2 times a day regularly will usually help prevent this problem from occurring.  A mild laxative (prune juice, Milk of Magnesia, MiraLax, etc) should be taken according to package directions if there are no bowel movements after 48 hours.   5. Watch out for diarrhea.  If you have many loose bowel movements, simplify your diet to bland foods & liquids for  a few days.  Stop any stool softeners and decrease your fiber supplement.  Switching to mild anti-diarrheal medications (Kayopectate, Pepto Bismol) can help.  If this worsens or does not improve, please call us. 6. Wash / shower every day.  You may shower over the dressings as they are waterproof.  Continue to shower over incision(s)  after the dressing is off. 7. Remove your waterproof bandages 5 days after surgery.  You may leave the incision open to air.  You may replace a dressing/Band-Aid to cover the incision for comfort if you wish.  8. ACTIVITIES as tolerated:   a. You may resume regular (light) daily activities beginning the next day--such as daily self-care, walking, climbing stairs--gradually increasing activities as tolerated.  If you can walk 30 minutes without difficulty, it is safe to try more intense activity such as jogging, treadmill, bicycling, low-impact aerobics, swimming, etc. b. Save the most intensive and strenuous activity for last such as sit-ups, heavy lifting, contact sports, etc  Refrain from any heavy lifting or straining until you are off narcotics for pain control.   c. DO NOT PUSH THROUGH PAIN.  Let pain be your guide: If it hurts to do something, don't do it.  Pain is your body warning you to avoid that activity for another week until the pain goes down. d. You may drive when you are no longer taking prescription pain medication, you can comfortably wear a seatbelt, and you can safely maneuver your car and apply brakes. e. Bonita Quin may have sexual intercourse when it is comfortable.  9. FOLLOW UP in our office a. Please call [facility] at [phone] to set up an appointment to see your surgeon in the office for a follow-up appointment approximately 2-3 weeks after your surgery. b. Make sure that you call for this appointment the day you arrive home to insure a convenient appointment time. 10. IF YOU HAVE DISABILITY OR FAMILY LEAVE FORMS, BRING THEM TO THE OFFICE FOR PROCESSING.  DO NOT GIVE THEM TO YOUR DOCTOR.   WHEN TO CALL us [phone]: 1. Poor pain control 2. Reactions / problems with new medications (rash/itching, nausea, etc)  3. Fever over 101.5 F (38.5 C) 4. Inability to urinate 5. Nausea and/or vomiting 6. Worsening swelling or bruising 7. Continued bleeding from incision. 8. Increased  pain, redness, or drainage from the incision   The clinic staff is available to answer your questions during regular business hours (8:30am-5pm).  Please dont hesitate to call and ask to speak to one of our nurses for clinical concerns.   If you have a medical emergency, go to the nearest emergency room or call 911.  A surgeon from [facility] is always on call at the [facility], [place] [address], [place], [place]  [address] ? MAIN: [phone] ? TOLL FREE: [phone] ?  FAX [phone] [email or web address]   Biliary Colic, Adult Biliary colic is severe pain caused by a problem with a small organ in the upper right part of your belly (gallbladder). The gallbladder stores a digestive fluid produced in the liver (bile) that helps the body break down fat. Bile and other digestive enzymes are carried from the liver to the small intestine though tube-like structures (bile ducts). The gallbladder and the bile ducts form the biliary tract. Sometimes hard deposits of digestive fluids form in the gallbladder (gallstones) and block the flow of bile from the gallbladder, causing biliary colic. This condition is  also called a gallbladder attack. Gallstones can be as small as a grain of sand or as big as a golf ball. There could be just one gallstone in the gallbladder, or there could be many. What are the causes? Biliary colic is usually caused by gallstones. Less often, a tumor could block the flow of bile from the gallbladder and trigger biliary colic. What increases the risk? This condition is more likely to develop in:  Women.  People of Hispanic descent.  People with a family history of gallstones.  People who are obese.  People who suddenly or quickly lose weight.  People who eat a high-calorie, low-fiber diet that is rich in refined carbs (carbohydrates), such as white bread and white rice.  People who have an  intestinal disease that affects nutrient absorption, such as Crohn disease.  People who have a metabolic condition, such as metabolic syndrome or diabetes. What are the signs or symptoms? Severe pain in the upper right side of the belly is the main symptom of biliary colic. You may feel this pain below the chest but above the hip. This pain often occurs at night or after eating a very fatty meal. This pain may get worse for up to an hour and last as long as 12 hours. In most cases, the pain fades (subsides) within a couple hours. Other symptoms of this condition include:  Nausea and vomiting.  Pain under the right shoulder. How is this diagnosed? This condition is diagnosed based on your medical history, your symptoms, and a physical exam. You may have tests, including:  Blood tests to rule out infection or inflammation of the bile ducts, gallbladder, pancreas, or liver.  Imaging studies such as:  Ultrasound.  CT scan.  MRI. In some cases, you may need to have an imaging study done using a small amount of radioactive material (nuclear medicine) to confirm the diagnosis. How is this treated? Treatment for this condition may include medicine to relieve your pain or nausea. If you have gallstones that are causing biliary colic, you may need surgery to remove the gallbladder (cholecystectomy). Gallstones can also be dissolved gradually with medicine. It may take months or years before the gallstones are completely gone. Follow these instructions at home:  Take over-the-counter and prescription medicines only as told by your health care provider.  Drink enough fluid to keep your urine clear or pale yellow.  Follow instructions from your health care provider about eating or drinking restrictions. These may include avoiding:  Fatty, greasy, and fried foods.  Any foods that make the pain worse.  Overeating.  Having a large meal after not eating for a while.  Keep all follow-up visits  as told by your health care provider. This is important. How is this prevented? Steps to prevent this condition include:  Maintaining a healthy body weight.  Getting regular exercise.  Eating a healthy, high-fiber, low-fat diet.  Limiting how much sugar and refined carbs you eat, such as sweets, white flour, and white rice. Contact a health care provider if:  Your pain lasts more than 5 hours.  You vomit.  You have a fever and chills.  Your pain gets worse. Get help right away if:  Your skin or the whites of your eyes look yellow (jaundice).  Your have tea-colored urine and light-colored stools.  You are dizzy or you faint. This information is not intended to replace advice given to you by your health care provider. Make sure you discuss any questions you  have with your health care provider. Document Released: 08/14/2005 Document Revised: 11/09/2015 Document Reviewed: 09/27/2015 Elsevier Interactive Patient Education  2017 [employer]

## 2016-04-08 DIAGNOSIS — Z87440 Personal history of urinary (tract) infections: Secondary | ICD-10-CM | POA: Diagnosis not present

## 2016-04-08 DIAGNOSIS — K219 Gastro-esophageal reflux disease without esophagitis: Secondary | ICD-10-CM | POA: Diagnosis not present

## 2016-04-08 DIAGNOSIS — Z885 Allergy status to narcotic agent status: Secondary | ICD-10-CM | POA: Diagnosis not present

## 2016-04-08 DIAGNOSIS — N946 Dysmenorrhea, unspecified: Secondary | ICD-10-CM | POA: Diagnosis not present

## 2016-04-08 DIAGNOSIS — Z90710 Acquired absence of both cervix and uterus: Secondary | ICD-10-CM | POA: Diagnosis not present

## 2016-04-08 DIAGNOSIS — Z881 Allergy status to other antibiotic agents status: Secondary | ICD-10-CM | POA: Diagnosis not present

## 2016-04-08 DIAGNOSIS — Z79899 Other long term (current) drug therapy: Secondary | ICD-10-CM | POA: Diagnosis not present

## 2016-04-08 DIAGNOSIS — Z882 Allergy status to sulfonamides status: Secondary | ICD-10-CM | POA: Diagnosis not present

## 2016-04-08 DIAGNOSIS — K810 Acute cholecystitis: Secondary | ICD-10-CM | POA: Diagnosis not present

## 2016-04-08 DIAGNOSIS — N809 Endometriosis, unspecified: Secondary | ICD-10-CM | POA: Diagnosis not present

## 2016-04-08 DIAGNOSIS — Z87891 Personal history of nicotine dependence: Secondary | ICD-10-CM | POA: Diagnosis not present

## 2016-04-08 DIAGNOSIS — F329 Major depressive disorder, single episode, unspecified: Secondary | ICD-10-CM | POA: Diagnosis not present

## 2016-04-08 DIAGNOSIS — K811 Chronic cholecystitis: Secondary | ICD-10-CM | POA: Diagnosis not present

## 2016-04-08 DIAGNOSIS — D649 Anemia, unspecified: Secondary | ICD-10-CM | POA: Diagnosis not present

## 2016-04-08 LAB — COMPREHENSIVE METABOLIC PANEL
ALT: 49 U/L (ref 14–54)
ANION GAP: 10 (ref 5–15)
AST: 52 U/L — ABNORMAL HIGH (ref 15–41)
Albumin: 4.1 g/dL (ref 3.5–5.0)
Alkaline Phosphatase: 83 U/L (ref 38–126)
BUN: 6 mg/dL (ref 6–20)
CHLORIDE: 104 mmol/L (ref 101–111)
CO2: 25 mmol/L (ref 22–32)
CREATININE: 0.8 mg/dL (ref 0.44–1.00)
Calcium: 9.2 mg/dL (ref 8.9–10.3)
GFR calc non Af Amer: >60 mL/min (ref >60)
Glucose, Bld: 169 mg/dL — ABNORMAL HIGH (ref 65–99)
POTASSIUM: 4.5 mmol/L (ref 3.5–5.1)
SODIUM: 139 mmol/L (ref 135–145)
Total Bilirubin: 0.4 mg/dL (ref 0.3–1.2)
Total Protein: 7.3 g/dL (ref 6.5–8.1)

## 2016-04-08 LAB — CBC
HEMATOCRIT: 38.5 % (ref 36.0–46.0)
HEMOGLOBIN: 12.7 g/dL (ref 12.0–15.0)
MCH: 28.4 pg (ref 26.0–34.0)
MCHC: 33 g/dL (ref 30.0–36.0)
MCV: 86.1 fL (ref 78.0–100.0)
Platelets: 263 10*3/uL (ref 150–400)
RBC: 4.47 MIL/uL (ref 3.87–5.11)
RDW: 12.9 % (ref 11.5–15.5)
WBC: 11.9 10*3/uL — AB (ref 4.0–10.5)

## 2016-04-08 LAB — LIPASE, BLOOD: Lipase: 18 U/L (ref 11–51)

## 2016-04-08 MED ORDER — ENSURE ENLIVE PO LIQD: 237.0000 mL | Freq: Two times a day (BID) | ORAL | Status: DC

## 2016-04-08 MED ORDER — PANTOPRAZOLE SODIUM 40 MG PO TBEC: 40.0000 mg | DELAYED_RELEASE_TABLET | Freq: Every day | ORAL | Status: DC

## 2016-04-08 MED ORDER — SACCHAROMYCES BOULARDII 250 MG PO CAPS
250.0000 mg | ORAL_CAPSULE | Freq: Two times a day (BID) | ORAL | Status: DC
  Administered 2016-04-08 – 2016-04-09 (×2): 250 mg via ORAL
  Filled 2016-04-08 (×2): qty 1

## 2016-04-08 MED ORDER — MENTHOL 3 MG MT LOZG: 1.0000 | LOZENGE | OROMUCOSAL | Status: DC | PRN

## 2016-04-08 MED ORDER — NAPROXEN 500 MG PO TABS
500.0000 mg | ORAL_TABLET | Freq: Three times a day (TID) | ORAL | Status: DC
  Administered 2016-04-09: 500 mg via ORAL
  Filled 2016-04-08 (×4): qty 1

## 2016-04-08 MED ORDER — DIAZEPAM 2 MG PO TABS: 2.0000 mg | ORAL_TABLET | Freq: Four times a day (QID) | ORAL | Status: DC | PRN

## 2016-04-08 MED ORDER — OXYCODONE HCL 5 MG PO TABS
5.0000 mg | ORAL_TABLET | ORAL | Status: DC | PRN
  Administered 2016-04-08 – 2016-04-09 (×3): 10 mg via ORAL
  Administered 2016-04-09: 5 mg via ORAL
  Administered 2016-04-09 (×2): 10 mg via ORAL
  Filled 2016-04-08 (×6): qty 2

## 2016-04-08 MED ORDER — METHOCARBAMOL 500 MG PO TABS
1000.0000 mg | ORAL_TABLET | Freq: Four times a day (QID) | ORAL | Status: DC
  Administered 2016-04-08 – 2016-04-09 (×4): 1000 mg via ORAL
  Filled 2016-04-08 (×4): qty 2

## 2016-04-08 NOTE — Progress Notes (Signed)
1 Day Post-Op  Subjective: Having significant RUQ not controlled by current po pain meds.  Also with nausea and unable to tolerate much of a diet this AM  Objective: Vital signs in last 24 hours: Temp:  [97.7 F (36.5 C)-99.5 F (37.5 C)] 99.5 F (37.5 C) (01/13 0936) Pulse Rate:  [67-105] 105 (01/13 0936) Resp:  [10-16] 15 (01/13 0936) BP: (99-129)/(58-92) 127/79 (01/13 0936) SpO2:  [94 %-100 %] 94 % (01/13 0936) Weight:  [101.6 kg (224 lb)] 101.6 kg (224 lb) (01/12 1350)   Intake/Output from previous day: 01/12 0701 - 01/13 0700 In: 3315 [P.O.:240; I.V.:3075] Out: 2300 [Urine:2300] Intake/Output this shift: Total I/O In: 240 [P.O.:240] Out: -    General appearance: alert and cooperative GI: soft, TTP in RUQ  Incision: dressing intact  Lab Results:   Recent Labs  04/06/16 1205 04/08/16 0512  WBC 6.5 11.9*  HGB 13.3 12.7  HCT 39.4 38.5  PLT 264 263   BMET  Recent Labs  04/06/16 1205 04/08/16 0512  NA 139 139  K 4.1 4.5  CL 104 104  CO2 27 25  GLUCOSE 74 169*  BUN 15 6  CREATININE 0.90 0.80  CALCIUM 9.4 9.2   PT/INR No results for input(s): LABPROT, INR in the last 72 hours. ABG No results for input(s): PHART, HCO3 in the last 72 hours.  Invalid input(s): PCO2, PO2  MEDS, Scheduled . [START ON 04/12/2016] estradiol  3 g Vaginal Weekly  . lip balm  1 application Topical BID  . multivitamin with minerals  1 tablet Oral Daily  . naproxen  500 mg Oral BID WC  . sertraline  100 mg Oral QHS  . sincalide  2 mcg Intravenous Once  . sodium chloride flush  3 mL Intravenous Q12H  . vitamin C  500 mg Oral BID    Studies/Results: Dg Cholangiogram Operative  Result Date: 04/08/2016 CLINICAL DATA:  Abdominal pain, nausea EXAM: INTRAOPERATIVE CHOLANGIOGRAM TECHNIQUE: Cholangiographic images from the C-arm fluoroscopic device were submitted for interpretation post-operatively. Please see the procedural report for the amount of contrast and the fluoroscopy  time utilized. COMPARISON:  In the V1 well the mean FINDINGS: No persistent filling defects in the distal common duct. Intrahepatic ducts are not opacified. There is extravasation of contrast around the proximal CBD. Contrast passes into the duodenum. : Negative for retained common duct stone. Electronically Signed   By: Corlis Leak  Hassell M.D.   On: 04/08/2016 09:03   Nm Hepato W/eject Fract  Result Date: 04/07/2016 CLINICAL DATA:  Abdominal pain and nausea for 1 week. EXAM: NUCLEAR MEDICINE HEPATOBILIARY IMAGING WITH GALLBLADDER EF TECHNIQUE: Sequential images of the abdomen were obtained out to 60 minutes following intravenous administration of radiopharmaceutical. After slow intravenous infusion of 2.0 micrograms Cholecystokinin, gallbladder ejection fraction was determined. RADIOPHARMACEUTICALS:  5.0 mCi Tc-10755m Choletec IV COMPARISON:  Ultrasound, 04/06/2016 FINDINGS: There is homogeneous radiotracer uptake by the liver. Radiotracer activity was seen early within the intra and extrahepatic biliary tree, and within the gallbladder and small bowel indicating patency of the cystic duct and common bile duct. Imaging was then performed following the injection of CCK to promote gallbladder contraction. Calculated gallbladder ejection fraction is 37%. (At 60 min, normal ejection fraction is greater than 40%.) IMPRESSION: 1. No evidence of acute cholecystitis. Normal filling of the gallbladder with radiotracer. Patent common bile duct with normal small bowel activity. 2. Borderline gallbladder dysfunction with a slightly reduced ejection fraction of 37%. Electronically Signed   By: Amie Portlandavid  Ormond  M.D.   On: 04/07/2016 14:47   US Abdomen Limited Ruq  Result Date: 04/06/2016 CLINICAL DATA:  Four days of right upper quadrant epigastric pain especially postprandially. EXAM: US ABDOMEN LIMITED - RIGHT UPPER QUADRANT COMPARISON:  None in PACs FINDINGS: Gallbladder: The gallbladder is adequately distended. There is minimal  wall thickening focally along the anterior aspect of the gallbladder. There is no evidence of gallbladder polyps, stones, or sludge. There is a positive sonographic Murphy's sign. Common bile duct: Diameter: 1.8 mm Liver: The hepatic echotexture is normal. There is no focal mass or ductal dilation. IMPRESSION: Focal gallbladder wall thickening and positive sonographic Murphy's sign. No stones or sludge are observed. The findings could reflect acute cholecystitis in the appropriate clinical setting. Normal appearing liver and common bile duct. Electronically Signed   By: David  [place]aziland M.D.   On: 04/06/2016 13:08    Assessment: s/p Procedure(s): LAPAROSCOPIC CHOLECYSTECTOMY SINGLE SITE WITH INTRAOPERATIVE CHOLANGIOGRAM Patient Active Problem List   Diagnosis Date Noted  . Acute on chronic cholecystitis s/p laparoscopic cholecystectomy 04/07/2016 04/07/2016  . Abdominal pain, right upper quadrant 04/06/2016    Pt with significant post op pain.  F/U labs this AM look ok  Plan: Advance diet as tolerated Will switch to Oxycodone for better pain control.  Dilaudid as needed. Incentive spirometry Ambulate Don't think she is ready to go home today   LOS: 0 days     .Vanita Panda, MD [facility], [place] [phone]   04/08/2016 10:48 AM

## 2016-04-09 DIAGNOSIS — N946 Dysmenorrhea, unspecified: Secondary | ICD-10-CM | POA: Diagnosis not present

## 2016-04-09 DIAGNOSIS — K219 Gastro-esophageal reflux disease without esophagitis: Secondary | ICD-10-CM | POA: Diagnosis not present

## 2016-04-09 DIAGNOSIS — F329 Major depressive disorder, single episode, unspecified: Secondary | ICD-10-CM | POA: Diagnosis not present

## 2016-04-09 DIAGNOSIS — Z87440 Personal history of urinary (tract) infections: Secondary | ICD-10-CM | POA: Diagnosis not present

## 2016-04-09 DIAGNOSIS — D649 Anemia, unspecified: Secondary | ICD-10-CM | POA: Diagnosis not present

## 2016-04-09 DIAGNOSIS — Z87891 Personal history of nicotine dependence: Secondary | ICD-10-CM | POA: Diagnosis not present

## 2016-04-09 DIAGNOSIS — Z881 Allergy status to other antibiotic agents status: Secondary | ICD-10-CM | POA: Diagnosis not present

## 2016-04-09 DIAGNOSIS — K811 Chronic cholecystitis: Secondary | ICD-10-CM | POA: Diagnosis not present

## 2016-04-09 DIAGNOSIS — Z79899 Other long term (current) drug therapy: Secondary | ICD-10-CM | POA: Diagnosis not present

## 2016-04-09 DIAGNOSIS — Z90710 Acquired absence of both cervix and uterus: Secondary | ICD-10-CM | POA: Diagnosis not present

## 2016-04-09 DIAGNOSIS — K810 Acute cholecystitis: Secondary | ICD-10-CM | POA: Diagnosis not present

## 2016-04-09 DIAGNOSIS — N809 Endometriosis, unspecified: Secondary | ICD-10-CM | POA: Diagnosis not present

## 2016-04-09 DIAGNOSIS — Z885 Allergy status to narcotic agent status: Secondary | ICD-10-CM | POA: Diagnosis not present

## 2016-04-09 DIAGNOSIS — Z882 Allergy status to sulfonamides status: Secondary | ICD-10-CM | POA: Diagnosis not present

## 2016-04-09 LAB — CBC
HEMATOCRIT: 36.6 % (ref 36.0–46.0)
HEMOGLOBIN: 12.1 g/dL (ref 12.0–15.0)
MCH: 29.2 pg (ref 26.0–34.0)
MCHC: 33.1 g/dL (ref 30.0–36.0)
MCV: 88.4 fL (ref 78.0–100.0)
Platelets: 256 10*3/uL (ref 150–400)
RBC: 4.14 MIL/uL (ref 3.87–5.11)
RDW: 13.5 % (ref 11.5–15.5)
WBC: 8.8 10*3/uL (ref 4.0–10.5)

## 2016-04-09 LAB — HEPATIC FUNCTION PANEL
ALBUMIN: 3.8 g/dL (ref 3.5–5.0)
ALT: 51 U/L (ref 14–54)
AST: 34 U/L (ref 15–41)
Alkaline Phosphatase: 78 U/L (ref 38–126)
BILIRUBIN TOTAL: 0.5 mg/dL (ref 0.3–1.2)
Bilirubin, Direct: <0.1 mg/dL — ABNORMAL LOW (ref 0.1–0.5)
TOTAL PROTEIN: 6.7 g/dL (ref 6.5–8.1)

## 2016-04-09 LAB — CREATININE, SERUM
CREATININE: 0.82 mg/dL (ref 0.44–1.00)
GFR calc Af Amer: >60 mL/min (ref >60)

## 2016-04-09 LAB — MAGNESIUM: Magnesium: 2 mg/dL (ref 1.7–2.4)

## 2016-04-09 LAB — POTASSIUM: Potassium: 3.6 mmol/L (ref 3.5–5.1)

## 2016-04-09 LAB — LIPASE, BLOOD: Lipase: 17 U/L (ref 11–51)

## 2016-04-09 MED ORDER — POLYETHYLENE GLYCOL 3350 17 G PO PACK
17.0000 g | PACK | Freq: Two times a day (BID) | ORAL | Status: DC
  Filled 2016-04-09: qty 1

## 2016-04-09 MED ORDER — AMOXICILLIN-POT CLAVULANATE 875-125 MG PO TABS
1.0000 | ORAL_TABLET | Freq: Two times a day (BID) | ORAL | Status: DC
  Administered 2016-04-09: 1 via ORAL
  Filled 2016-04-09: qty 1

## 2016-04-09 MED ORDER — NAPROXEN 500 MG PO TABS: 500.0000 mg | ORAL_TABLET | Freq: Two times a day (BID) | ORAL | 1 refills | Status: DC | PRN

## 2016-04-09 MED ORDER — OXYCODONE HCL 5 MG PO TABS: 5.0000 mg | ORAL_TABLET | ORAL | 0 refills | Status: DC | PRN

## 2016-04-09 MED ORDER — METHOCARBAMOL 500 MG PO TABS: 500.0000 mg | ORAL_TABLET | Freq: Four times a day (QID) | ORAL | 1 refills | Status: DC | PRN

## 2016-04-09 NOTE — Progress Notes (Addendum)
[place] Kaitlin Jenkins, [place] [address] Phone: [phone] FAX: [phone]   Kaitlin Jenkins [id number] [birth year]    Problem List:   Principal Problem:   Acute on chronic cholecystitis s/p laparoscopic cholecystectomy 04/07/2016 Active Problems:   Abdominal pain, right upper quadrant   2 Days Post-Op  04/07/2016  Procedure(s): LAPAROSCOPIC CHOLECYSTECTOMY SINGLE SITE WITH INTRAOPERATIVE CHOLANGIOGRAM   Assessment  Improving - pain c/w incisional soreness  Plan:  -retry solids -Replace IV as needed. -Complete antibiotics orally for now.  IV if worse (less likely) Okay to shower.  Switch dressing to new dry one. -VTE prophylaxis- SCDs, etc -mobilize as tolerated to help recovery  D/C patient from hospital when patient meets criteria (anticipate in 0-1 day(s)):  Tolerating oral intake well Ambulating well Adequate pain control without IV medications Urinating  Having flatus Disposition planning in place  I updated the patient's status to the patient and nurse.  Recommendations were made.  Questions were answered.  They expressed understanding & appreciation.    Adin Hector, M.D., F.A.C.S. Gastrointestinal and Minimally Invasive Surgery [facility] [address] [place],  [address] [phone] Main / Paging   04/09/2016  CARE TEAM:  PCP: No primary care provider on file.  Outpatient Care Team: No care team member to display  Inpatient Treatment Team: Treatment Team: Attending Provider: Md Edison Pace, MD; Registered Nurse: Yvette Rack, RN; Registered Nurse: Claris Pong, RN; Technician: Linton Rump, NT; Registered Nurse: Dellie Burns, RN  Subjective:  Sore only at incision.  Oxycodone working better  Wishes dressing removed.  Had one episode of nausea vomiting yesterday.  Not nauseated now.  Wants to try solids.  IV fell out.  Not  replaced yet.  Wishes to go home.  Objective:  Vital signs:  Vitals:   04/08/16 1813 04/08/16 2100 04/09/16 0100 04/09/16 0528  BP: 114/75 118/69 111/67 (!) 102/59  Pulse: 95 (!) 102 92 83  Resp: '15 20 18 18  ' Temp: 98.6 F (37 C) 99.1 F (37.3 C) 98.6 F (37 C) 98.6 F (37 C)  TempSrc: Oral Oral Oral Axillary  SpO2: 96% 96% 93% 95%  Weight:      Height:        Last BM Date: 04/05/16  Intake/Output   Yesterday:  01/13 0701 - 01/14 0700 In: 8088 [P.O.:1434; I.V.:3] Out: 0  This shift:  No intake/output data recorded.  Bowel function:  Flatus: YES  BM:  No  Drain: (No drain)   Physical Exam:  General: Pt awake/alert/oriented x4 in No acute distress.  Calm, joking Eyes: PERRL, normal EOM.  Sclera clear.  No icterus Neuro: CN II-XII intact w/o focal sensory/motor deficits. Lymph: No head/neck/groin lymphadenopathy Psych:  No delerium/psychosis/paranoia HENT: Normocephalic, Mucus membranes moist.  No thrush Neck: Supple, No tracheal deviation Chest: No chest wall pain w good excursion CV:  Pulses intact.  Regular rhythm MS: Normal AROM mjr joints.  No obvious deformity Abdomen: Soft.  Nondistended.  Mildly tender at incisions only.  No evidence of peritonitis.  No incarcerated hernias. Ext:  SCDs BLE.  No mjr edema.  No cyanosis Skin: No petechiae / purpura  Results:   Labs: Results for orders placed or performed during the hospital encounter of 04/06/16 (from the past 48 hour(s))  Lipase, blood     Status: None   Collection Time: 04/08/16  5:10 AM  Result Value Ref Range   Lipase  18 11 - 51 U/L  Comprehensive metabolic panel     Status: Abnormal   Collection Time: 04/08/16  5:12 AM  Result Value Ref Range   Sodium 139 135 - 145 mmol/L   Potassium 4.5 3.5 - 5.1 mmol/L   Chloride 104 101 - 111 mmol/L   CO2 25 22 - 32 mmol/L   Glucose, Bld 169 (H) 65 - 99 mg/dL   BUN 6 6 - 20 mg/dL   Creatinine, Ser 0.80 0.44 - 1.00 mg/dL   Calcium 9.2 8.9 - 10.3  mg/dL   Total Protein 7.3 6.5 - 8.1 g/dL   Albumin 4.1 3.5 - 5.0 g/dL   AST 52 (H) 15 - 41 U/L   ALT 49 14 - 54 U/L   Alkaline Phosphatase 83 38 - 126 U/L   Total Bilirubin 0.4 0.3 - 1.2 mg/dL   GFR calc non Af Amer >60 >60 mL/min   GFR calc Af Amer >60 >60 mL/min    Comment: (NOTE) The eGFR has been calculated using the CKD EPI equation. This calculation has not been validated in all clinical situations. eGFR's persistently <60 mL/min signify possible Chronic Kidney Disease.    Anion gap 10 5 - 15  CBC     Status: Abnormal   Collection Time: 04/08/16  5:12 AM  Result Value Ref Range   WBC 11.9 (H) 4.0 - 10.5 K/uL   RBC 4.47 3.87 - 5.11 MIL/uL   Hemoglobin 12.7 12.0 - 15.0 g/dL   HCT 38.5 36.0 - 46.0 %   MCV 86.1 78.0 - 100.0 fL   MCH 28.4 26.0 - 34.0 pg   MCHC 33.0 30.0 - 36.0 g/dL   RDW 12.9 11.5 - 15.5 %   Platelets 263 150 - 400 K/uL  CBC     Status: None   Collection Time: 04/09/16  4:07 AM  Result Value Ref Range   WBC 8.8 4.0 - 10.5 K/uL   RBC 4.14 3.87 - 5.11 MIL/uL   Hemoglobin 12.1 12.0 - 15.0 g/dL   HCT 36.6 36.0 - 46.0 %   MCV 88.4 78.0 - 100.0 fL   MCH 29.2 26.0 - 34.0 pg   MCHC 33.1 30.0 - 36.0 g/dL   RDW 13.5 11.5 - 15.5 %   Platelets 256 150 - 400 K/uL  Lipase, blood     Status: None   Collection Time: 04/09/16  4:07 AM  Result Value Ref Range   Lipase 17 11 - 51 U/L  Potassium     Status: None   Collection Time: 04/09/16  4:07 AM  Result Value Ref Range   Potassium 3.6 3.5 - 5.1 mmol/L    Comment: DELTA CHECK NOTED  Magnesium     Status: None   Collection Time: 04/09/16  4:07 AM  Result Value Ref Range   Magnesium 2.0 1.7 - 2.4 mg/dL  Creatinine, serum     Status: None   Collection Time: 04/09/16  4:07 AM  Result Value Ref Range   Creatinine, Ser 0.82 0.44 - 1.00 mg/dL   GFR calc non Af Amer >60 >60 mL/min   GFR calc Af Amer >60 >60 mL/min    Comment: (NOTE) The eGFR has been calculated using the CKD EPI equation. This calculation has not  been validated in all clinical situations. eGFR's persistently <60 mL/min signify possible Chronic Kidney Disease.   Hepatic function panel     Status: Abnormal   Collection Time: 04/09/16  4:07 AM  Result Value Ref  Range   Total Protein 6.7 6.5 - 8.1 g/dL   Albumin 3.8 3.5 - 5.0 g/dL   AST 34 15 - 41 U/L   ALT 51 14 - 54 U/L   Alkaline Phosphatase 78 38 - 126 U/L   Total Bilirubin 0.5 0.3 - 1.2 mg/dL   Bilirubin, Direct <0.1 (L) 0.1 - 0.5 mg/dL   Indirect Bilirubin NOT CALCULATED 0.3 - 0.9 mg/dL    Imaging / Studies: Dg Cholangiogram Operative  Result Date: 04/08/2016 CLINICAL DATA:  Abdominal pain, nausea EXAM: INTRAOPERATIVE CHOLANGIOGRAM TECHNIQUE: Cholangiographic images from the C-arm fluoroscopic device were submitted for interpretation post-operatively. Please see the procedural report for the amount of contrast and the fluoroscopy time utilized. COMPARISON:  In the V1 well the mean FINDINGS: No persistent filling defects in the distal common duct. Intrahepatic ducts are not opacified. There is extravasation of contrast around the proximal CBD. Contrast passes into the duodenum. : Negative for retained common duct stone. Electronically Signed   By: Lucrezia Europe M.D.   On: 04/08/2016 09:03   Nm Hepato W/eject Fract  Result Date: 04/07/2016 CLINICAL DATA:  Abdominal pain and nausea for 1 week. EXAM: NUCLEAR MEDICINE HEPATOBILIARY IMAGING WITH GALLBLADDER EF TECHNIQUE: Sequential images of the abdomen were obtained out to 60 minutes following intravenous administration of radiopharmaceutical. After slow intravenous infusion of 2.0 micrograms Cholecystokinin, gallbladder ejection fraction was determined. RADIOPHARMACEUTICALS:  5.0 mCi Tc-5mCholetec IV COMPARISON:  Ultrasound, 04/06/2016 FINDINGS: There is homogeneous radiotracer uptake by the liver. Radiotracer activity was seen early within the intra and extrahepatic biliary tree, and within the gallbladder and small bowel indicating  patency of the cystic duct and common bile duct. Imaging was then performed following the injection of CCK to promote gallbladder contraction. Calculated gallbladder ejection fraction is 37%. (At 60 min, normal ejection fraction is greater than 40%.) IMPRESSION: 1. No evidence of acute cholecystitis. Normal filling of the gallbladder with radiotracer. Patent common bile duct with normal small bowel activity. 2. Borderline gallbladder dysfunction with a slightly reduced ejection fraction of 37%. Electronically Signed   By: DLajean ManesM.D.   On: 04/07/2016 14:47    Medications / Allergies: per chart  Antibiotics: Anti-infectives    Start     Dose/Rate Route Frequency Ordered Stop   04/07/16 1600  cefTRIAXone (ROCEPHIN) 2 g in dextrose 5 % 50 mL IVPB    Comments:  Pharmacy may adjust dosing strength / duration / interval for maximal efficacy   2 g 100 mL/hr over 30 Minutes Intravenous On call 04/07/16 1554 04/07/16 1724   04/06/16 1400  cefTRIAXone (ROCEPHIN) 2 g in dextrose 5 % 50 mL IVPB     2 g 100 mL/hr over 30 Minutes Intravenous  Once 04/06/16 1357 04/06/16 1442        Note: Portions of this report may have been transcribed using voice recognition software. Every effort was made to ensure accuracy; however, inadvertent computerized transcription errors may be present.   Any transcriptional errors that result from this process are unintentional.     SAdin Hector M.D., F.A.C.S. Gastrointestinal and Minimally Invasive Surgery [facility] [address] [place] [phone] / Paging   04/09/2016

## 2016-04-10 ENCOUNTER — Encounter (HOSPITAL_COMMUNITY): Payer: Self-pay | Admitting: Surgery

## 2016-04-10 NOTE — Discharge Summary (Signed)
Physician Discharge Summary  Patient ID: Kaitlin Jenkins MRN: [id number] DOB/AGE: Mar 07, 1971  46 y.o.  Admit date: 04/06/2016 Discharge date: 04/09/2016  No care team member to display  Discharge Diagnoses:  Principal Problem:   Acute on chronic cholecystitis s/p laparoscopic cholecystectomy 04/07/2016 Active Problems:   Abdominal pain, right upper quadrant   POST-OPERATIVE DIAGNOSIS:   acute acalculus cholecystitis  SURGERY:  04/06/2016 - 04/07/2016  Procedure(s): LAPAROSCOPIC CHOLECYSTECTOMY SINGLE SITE WITH INTRAOPERATIVE CHOLANGIOGRAM  SURGEON:    Surgeon(s): Michael Boston, MD  Consults: None  Hospital Course:   The patient with biliary colic unrelenting.  Workup concerning for cholecystitis.  Admitted.  She underwent the surgery above.  Postoperatively, the patient gradually mobilized and advanced to a solid diet.  Pain and other symptoms were treated aggressively.    By the time of discharge, the patient was walking well the hallways, eating food, having flatus.  Pain was well-controlled on an oral medications.  Based on meeting discharge criteria and continuing to recover, I felt it was safe for the patient to be discharged from the hospital to further recover with close followup. Postoperative recommendations were discussed in detail.  They are written as well.  Discharged Condition: good  Disposition:  [facility], [place]. Schedule an appointment as soon as possible for a visit in 3 week(s).   Specialty:  General Surgery Why:  To follow up after your operation, To follow up after your hospital stay Contact information: [address] [place] [phone]          01-Home or Self Care  Discharge Instructions    Call MD for:    Complete by:  As directed    FEVER >101.5 F (Temperatures <101.55F occasionally happen and are not significant)   Call MD for:    Complete by:  As  directed    FEVER > 101.5 F  (temperatures < 101.5 F are not significant)   Call MD for:  extreme fatigue    Complete by:  As directed    Call MD for:  extreme fatigue    Complete by:  As directed    Call MD for:  persistant dizziness or light-headedness    Complete by:  As directed    Call MD for:  persistant dizziness or light-headedness    Complete by:  As directed    Call MD for:  persistant nausea and vomiting    Complete by:  As directed    Call MD for:  persistant nausea and vomiting    Complete by:  As directed    Call MD for:  redness, tenderness, or signs of infection (pain, swelling, redness, odor or green/yellow discharge around incision site)    Complete by:  As directed    Call MD for:  redness, tenderness, or signs of infection (pain, swelling, redness, odor or green/yellow discharge around incision site)    Complete by:  As directed    Call MD for:  severe uncontrolled pain    Complete by:  As directed    Call MD for:  severe uncontrolled pain    Complete by:  As directed    Diet - low sodium heart healthy    Complete by:  As directed    Follow a light diet the first few days at home.  Start with a bland diet such as soups, liquids, starchy foods, low fat foods, etc.   If you feel full, bloated,  or constipated, stay on a full liquid or pureed/blenderized diet for a few days until you feel better and no longer constipated. Gradually get back to a regular solid diet.  Avoid fast food or heavy meals the first week as you are more likely to get nauseated.   Diet - low sodium heart healthy    Complete by:  As directed    Follow a light diet the first few days at home.   Start with a bland diet such as soups, liquids, starchy foods, low fat foods, etc.   If you feel full, bloated, or constipated, stay on a full liquid or pureed/blenderized diet for a few days until you feel better and no longer constipated. Be sure to drink plenty of fluids every day to avoid getting  dehydrated (feeling dizzy, not urinating, etc.). Gradually add a fiber supplement to your diet   Discharge instructions    Complete by:  As directed    One the day of your discharge from the hospital (or the next business weekday), please call [facility] to set up or confirm an appointment to see your surgeon in the office for a follow-up appointment.  Usually it is 2-3 weeks after your surgery.  Other concerns If you are not getting better after two weeks or are noticing you are getting worse, contact our office [phone] for further advice.  We may need to adjust your medications, re-evaluate you in the office, send you to the emergency room, or see what other things we can do to help. The clinic staff is available to answer your questions during regular business hours (8:30am-5pm).  Please don't hesitate to call and ask to speak to one of our nurses for clinical concerns.    A surgeon from [facility] is always on call at the hospitals 24 hours/day If you have a medical emergency, go to the nearest emergency room or call 911.   Discharge instructions    Complete by:  As directed    See Discharge Instructions If you are not getting better after two weeks or are noticing you are getting worse, contact our office [phone] for further advice.  We may need to adjust your medications, re-evaluate you in the office, send you to the emergency room, or see what other things we can do to help. The clinic staff is available to answer your questions during regular business hours (8:30am-5pm).  Please don't hesitate to call and ask to speak to one of our nurses for clinical concerns.    A surgeon from [facility] is always on call at the hospitals 24 hours/day If you have a medical emergency, go to the nearest emergency room or call 911.   Driving Restrictions    Complete by:  As directed    You may drive when you are no longer taking prescription pain  medication, you can comfortably wear a seatbelt, and you can safely maneuver your car and apply brakes.   Driving Restrictions    Complete by:  As directed    You may drive when you are no longer taking narcotic prescription pain medication, you can comfortably wear a seatbelt, and you can safely make sudden turns/stops to protect yourself without hesitating due to pain.   Increase activity slowly    Complete by:  As directed    Increase activity slowly    Complete by:  As directed    Start light daily activities --- self-care, walking, climbing stairs-  beginning the day after surgery.  Gradually increase activities as tolerated.  Control your pain to be active.  Stop when you are tired.  Ideally, walk several times a day, eventually an hour a day.   Most people are back to most day-to-day activities in a few weeks.  It takes 4-8 weeks to get back to unrestricted, intense activity. If you can walk 30 minutes without difficulty, it is safe to try more intense activity such as jogging, treadmill, bicycling, low-impact aerobics, swimming, etc. Save the most intensive and strenuous activity for last (Usually 4-8 weeks after surgery) such as sit-ups, heavy lifting, contact sports, etc.  Refrain from any intense heavy lifting or straining until you are off narcotics for pain control.  You will have off days, but things should improve week-by-week. DO NOT PUSH THROUGH PAIN.  Let pain be your guide: If it hurts to do something, don't do it.  Pain is your body warning you to avoid that activity for another week until the pain goes down.   Lifting restrictions    Complete by:  As directed    You may resume regular (light) daily activities beginning the next day-such as daily self-care, walking, climbing stairs-gradually increasing activities as tolerated.   If you can walk 30 minutes without difficulty, it is safe to try more intense activity such as jogging, treadmill, bicycling, low-impact aerobics,  swimming, etc. Save the most intensive and strenuous activity for last such as sit-ups, heavy lifting, contact sports, etc   Refrain from any heavy lifting or straining until you are off narcotics for pain control.   DO NOT PUSH THROUGH PAIN.   Let pain be your guide: If it hurts to do something, don't do it.   Pain is your body warning you to avoid that activity for another week until the pain goes down.   Lifting restrictions    Complete by:  As directed    If you can walk 30 minutes without difficulty, it is safe to try more intense activity such as jogging, treadmill, bicycling, low-impact aerobics, swimming, etc. Save the most intensive and strenuous activity for last (Usually 4-8 weeks after surgery) such as sit-ups, heavy lifting, contact sports, etc.  Refrain from any intense heavy lifting or straining until you are off narcotics for pain control.  You will have off days, but things should improve week-by-week. DO NOT PUSH THROUGH PAIN.  Let pain be your guide: If it hurts to do something, don't do it.  Pain is your body warning you to avoid that activity for another week until the pain goes down.   May shower / Bathe    Complete by:  As directed    Wash / shower every day.  You may shower over the dressings as they are waterproof.  Continue to shower over incision(s) after the dressing is off.   May walk up steps    Complete by:  As directed    May walk up steps    Complete by:  As directed    No wound care    Complete by:  As directed    It is good for closed incision and even open wounds to be washed every day.  Shower every day.  Short baths are Senters.  Wash the incisions and wounds clean with soap & water.    If you have a closed incision(s), wash the incision with soap & water every day.  You may leave closed incisions open to air if it  is dry.   You may cover the incision with clean gauze & replace it after your daily shower for comfort. If you have skin tapes (Steristrips) or  skin glue (Dermabond) on your incision, leave them in place.  They will fall off on their own like a scab.  You may trim any edges that curl up with clean scissors.  If you have staples, set up an appointment for them to be removed in the office in 10 days after surgery.  If you have a drain, wash around the skin exit site with soap & water and place a new dressing of gauze or band aid around the skin every day.  Keep the drain site clean & dry.   Remove dressing in 72 hours    Complete by:  As directed    Remove your waterproof bandages 3-5 days after surgery.   You may leave the incision(s) open to air.   You may replace a dressing/Band-Aid to cover the incision for comfort if you wish.   Sexual Activity Restrictions    Complete by:  As directed    You may have sexual intercourse when it is comfortable. If it hurts to do something, stop.   Sexual Activity Restrictions    Complete by:  As directed    You may have sexual intercourse when it is comfortable. If it hurts to do something, stop.      Allergies as of 04/09/2016      Reactions   Morphine And Related Nausea And Vomiting   Ciprofloxacin Swelling   Sulfa Drugs Cross Reactors Other (See Comments)   Blood will not clot, runs very thin; bruises      Medication List    TAKE these medications   ALPRAZolam 0.5 MG tablet Commonly known as:  XANAX Take 0.5 mg by mouth at bedtime as needed for sleep.   CALCIUM PO Take 1 tablet by mouth daily.   estradiol 0.1 MG/GM vaginal cream Commonly known as:  ESTRACE Place 3 g vaginally once a week. wednesdays   methocarbamol 500 MG tablet Commonly known as:  ROBAXIN Take 1-2 tablets (500-1,000 mg total) by mouth every 6 (six) hours as needed for muscle spasms.   multivitamin with minerals tablet Take 1 tablet by mouth daily.   naproxen 500 MG tablet Commonly known as:  NAPROSYN Take 1 tablet (500 mg total) by mouth every 12 (twelve) hours as needed for mild pain or moderate pain.    naproxen 500 MG tablet Commonly known as:  NAPROSYN Take 1 tablet (500 mg total) by mouth every 12 (twelve) hours as needed for mild pain or moderate pain.   oxyCODONE 5 MG immediate release tablet Commonly known as:  Oxy IR/ROXICODONE Take 1-2 tablets (5-10 mg total) by mouth every 4 (four) hours as needed for moderate pain, severe pain or breakthrough pain.   PROBIOTIC PO Take 1 capsule by mouth daily.   sertraline 100 MG tablet Commonly known as:  ZOLOFT Take 100 mg by mouth at bedtime.   traMADol 50 MG tablet Commonly known as:  ULTRAM Take 1-2 tablets (50-100 mg total) by mouth every 6 (six) hours as needed for moderate pain or severe pain.   Vitamin D3 5000 units Caps Take 10,000 Units by mouth daily.       Significant Diagnostic Studies:  Results for orders placed or performed during the hospital encounter of 04/06/16 (from the past 72 hour(s))  Lipase, blood     Status: None   Collection  Time: 04/08/16  5:10 AM  Result Value Ref Range   Lipase 18 11 - 51 U/L  Comprehensive metabolic panel     Status: Abnormal   Collection Time: 04/08/16  5:12 AM  Result Value Ref Range   Sodium 139 135 - 145 mmol/L   Potassium 4.5 3.5 - 5.1 mmol/L   Chloride 104 101 - 111 mmol/L   CO2 25 22 - 32 mmol/L   Glucose, Bld 169 (H) 65 - 99 mg/dL   BUN 6 6 - 20 mg/dL   Creatinine, Ser 0.80 0.44 - 1.00 mg/dL   Calcium 9.2 8.9 - 10.3 mg/dL   Total Protein 7.3 6.5 - 8.1 g/dL   Albumin 4.1 3.5 - 5.0 g/dL   AST 52 (H) 15 - 41 U/L   ALT 49 14 - 54 U/L   Alkaline Phosphatase 83 38 - 126 U/L   Total Bilirubin 0.4 0.3 - 1.2 mg/dL   GFR calc non Af Amer >60 >60 mL/min   GFR calc Af Amer >60 >60 mL/min    Comment: (NOTE) The eGFR has been calculated using the CKD EPI equation. This calculation has not been validated in all clinical situations. eGFR's persistently <60 mL/min signify possible Chronic Kidney Disease.    Anion gap 10 5 - 15  CBC     Status: Abnormal   Collection Time:  04/08/16  5:12 AM  Result Value Ref Range   WBC 11.9 (H) 4.0 - 10.5 K/uL   RBC 4.47 3.87 - 5.11 MIL/uL   Hemoglobin 12.7 12.0 - 15.0 g/dL   HCT 38.5 36.0 - 46.0 %   MCV 86.1 78.0 - 100.0 fL   MCH 28.4 26.0 - 34.0 pg   MCHC 33.0 30.0 - 36.0 g/dL   RDW 12.9 11.5 - 15.5 %   Platelets 263 150 - 400 K/uL  CBC     Status: None   Collection Time: 04/09/16  4:07 AM  Result Value Ref Range   WBC 8.8 4.0 - 10.5 K/uL   RBC 4.14 3.87 - 5.11 MIL/uL   Hemoglobin 12.1 12.0 - 15.0 g/dL   HCT 36.6 36.0 - 46.0 %   MCV 88.4 78.0 - 100.0 fL   MCH 29.2 26.0 - 34.0 pg   MCHC 33.1 30.0 - 36.0 g/dL   RDW 13.5 11.5 - 15.5 %   Platelets 256 150 - 400 K/uL  Lipase, blood     Status: None   Collection Time: 04/09/16  4:07 AM  Result Value Ref Range   Lipase 17 11 - 51 U/L  Potassium     Status: None   Collection Time: 04/09/16  4:07 AM  Result Value Ref Range   Potassium 3.6 3.5 - 5.1 mmol/L    Comment: DELTA CHECK NOTED  Magnesium     Status: None   Collection Time: 04/09/16  4:07 AM  Result Value Ref Range   Magnesium 2.0 1.7 - 2.4 mg/dL  Creatinine, serum     Status: None   Collection Time: 04/09/16  4:07 AM  Result Value Ref Range   Creatinine, Ser 0.82 0.44 - 1.00 mg/dL   GFR calc non Af Amer >60 >60 mL/min   GFR calc Af Amer >60 >60 mL/min    Comment: (NOTE) The eGFR has been calculated using the CKD EPI equation. This calculation has not been validated in all clinical situations. eGFR's persistently <60 mL/min signify possible Chronic Kidney Disease.   Hepatic function panel     Status: Abnormal  Collection Time: 04/09/16  4:07 AM  Result Value Ref Range   Total Protein 6.7 6.5 - 8.1 g/dL   Albumin 3.8 3.5 - 5.0 g/dL   AST 34 15 - 41 U/L   ALT 51 14 - 54 U/L   Alkaline Phosphatase 78 38 - 126 U/L   Total Bilirubin 0.5 0.3 - 1.2 mg/dL   Bilirubin, Direct <0.1 (L) 0.1 - 0.5 mg/dL   Indirect Bilirubin NOT CALCULATED 0.3 - 0.9 mg/dL    Dg Cholangiogram Operative  Result Date:  04/08/2016 CLINICAL DATA:  Abdominal pain, nausea EXAM: INTRAOPERATIVE CHOLANGIOGRAM TECHNIQUE: Cholangiographic images from the C-arm fluoroscopic device were submitted for interpretation post-operatively. Please see the procedural report for the amount of contrast and the fluoroscopy time utilized. COMPARISON:  In the V1 well the mean FINDINGS: No persistent filling defects in the distal common duct. Intrahepatic ducts are not opacified. There is extravasation of contrast around the proximal CBD. Contrast passes into the duodenum. : Negative for retained common duct stone. Electronically Signed   By: Lucrezia Europe M.D.   On: 04/08/2016 09:03   Nm Hepato W/eject Fract  Result Date: 04/07/2016 CLINICAL DATA:  Abdominal pain and nausea for 1 week. EXAM: NUCLEAR MEDICINE HEPATOBILIARY IMAGING WITH GALLBLADDER EF TECHNIQUE: Sequential images of the abdomen were obtained out to 60 minutes following intravenous administration of radiopharmaceutical. After slow intravenous infusion of 2.0 micrograms Cholecystokinin, gallbladder ejection fraction was determined. RADIOPHARMACEUTICALS:  5.0 mCi Tc-68mCholetec IV COMPARISON:  Ultrasound, 04/06/2016 FINDINGS: There is homogeneous radiotracer uptake by the liver. Radiotracer activity was seen early within the intra and extrahepatic biliary tree, and within the gallbladder and small bowel indicating patency of the cystic duct and common bile duct. Imaging was then performed following the injection of CCK to promote gallbladder contraction. Calculated gallbladder ejection fraction is 37%. (At 60 min, normal ejection fraction is greater than 40%.) IMPRESSION: 1. No evidence of acute cholecystitis. Normal filling of the gallbladder with radiotracer. Patent common bile duct with normal small bowel activity. 2. Borderline gallbladder dysfunction with a slightly reduced ejection fraction of 37%. Electronically Signed   By: DLajean ManesM.D.   On: 04/07/2016 14:47   [place]Abdomen  Limited Ruq  Result Date: 04/06/2016 CLINICAL DATA:  Four days of right upper quadrant epigastric pain especially postprandially. EXAM: [place]ABDOMEN LIMITED - RIGHT UPPER QUADRANT COMPARISON:  None in PACs FINDINGS: Gallbladder: The gallbladder is adequately distended. There is minimal wall thickening focally along the anterior aspect of the gallbladder. There is no evidence of gallbladder polyps, stones, or sludge. There is a positive sonographic Murphy's sign. Common bile duct: Diameter: 1.8 mm Liver: The hepatic echotexture is normal. There is no focal mass or ductal dilation. IMPRESSION: Focal gallbladder wall thickening and positive sonographic Murphy's sign. No stones or sludge are observed. The findings could reflect acute cholecystitis in the appropriate clinical setting. Normal appearing liver and common bile duct. Electronically Signed   By: David  JMartiniqueM.D.   On: 04/06/2016 13:08    Discharge Exam: Blood pressure (!) 102/59, pulse 83, temperature 98.6 F (37 C), temperature source Axillary, resp. rate 18, height '5\' 7"'  (1.702 m), weight 101.6 kg (224 lb), SpO2 95 %.  General: Pt awake/alert/oriented x4 in No acute distress.  Calm, joking Eyes: PERRL, normal EOM.  Sclera clear.  No icterus Neuro: CN II-XII intact w/o focal sensory/motor deficits. Lymph: No head/neck/groin lymphadenopathy Psych:  No delerium/psychosis/paranoia HENT: Normocephalic, Mucus membranes moist.  No thrush Neck: Supple, No tracheal  deviation Chest: No chest wall pain w good excursion CV:  Pulses intact.  Regular rhythm MS: Normal AROM mjr joints.  No obvious deformity Abdomen: Soft.  Nondistended.  Mildly tender at incisions only.  No evidence of peritonitis.  No incarcerated hernias. Ext:  SCDs BLE.  No mjr edema.  No cyanosis Skin: No petechiae / purpura   Past Medical History:  Diagnosis Date  . Anemia    Mild normocytic anemia  . Bacterial vaginosis   . Dysmenorrhea   . Endometriosis    history of  endometriosis  . Gastroesophageal reflux disease   . H/O tubal ligation   . History of depression   . History of frequent urinary tract infections   . Pelvic pain   . Pyelonephritis    Right pyelonephritis  . Sepsis(995.91)     Past Surgical History:  Procedure Laterality Date  . ABDOMINAL HYSTERECTOMY    . CESAREAN SECTION    . OTHER SURGICAL HISTORY     Right breast cyst removal--Right ovarian cyst excision  . VAGINAL HYSTERECTOMY     with left salpingo-oophorectomy    Social History   Social History  . Marital status: Married    Spouse name: N/A  . Number of children: N/A  . Years of education: N/A   Occupational History  . Not on file.   Social History Main Topics  . Smoking status: Former Research scientist (life sciences)  . Smokeless tobacco: Never Used  . Alcohol use No  . Drug use: No  . Sexual activity: Not on file   Other Topics Concern  . Not on file   Social History Narrative  . No narrative on file    History reviewed. No pertinent family history.  No current facility-administered medications for this encounter.    Current Outpatient Prescriptions  Medication Sig Dispense Refill  . ALPRAZolam (XANAX) 0.5 MG tablet Take 0.5 mg by mouth at bedtime as needed for sleep.     Marland Kitchen CALCIUM PO Take 1 tablet by mouth daily.    . Cholecalciferol (VITAMIN D3) 5000 units CAPS Take 10,000 Units by mouth daily.    Marland Kitchen estradiol (ESTRACE) 0.1 MG/GM vaginal cream Place 3 g vaginally once a week. wednesdays    . Multiple Vitamins-Minerals (MULTIVITAMIN WITH MINERALS) tablet Take 1 tablet by mouth daily.    . Probiotic Product (PROBIOTIC PO) Take 1 capsule by mouth daily.    . sertraline (ZOLOFT) 100 MG tablet Take 100 mg by mouth at bedtime.   2  . methocarbamol (ROBAXIN) 500 MG tablet Take 1-2 tablets (500-1,000 mg total) by mouth every 6 (six) hours as needed for muscle spasms. 20 tablet 1  . naproxen (NAPROSYN) 500 MG tablet Take 1 tablet (500 mg total) by mouth every 12 (twelve) hours as  needed for mild pain or moderate pain. 30 tablet 1  . naproxen (NAPROSYN) 500 MG tablet Take 1 tablet (500 mg total) by mouth every 12 (twelve) hours as needed for mild pain or moderate pain. 30 tablet 1  . oxyCODONE (OXY IR/ROXICODONE) 5 MG immediate release tablet Take 1-2 tablets (5-10 mg total) by mouth every 4 (four) hours as needed for moderate pain, severe pain or breakthrough pain. 30 tablet 0  . traMADol (ULTRAM) 50 MG tablet Take 1-2 tablets (50-100 mg total) by mouth every 6 (six) hours as needed for moderate pain or severe pain. 30 tablet 0     Allergies  Allergen Reactions  . Morphine And Related Nausea And Vomiting  . Ciprofloxacin  Swelling  . Sulfa Drugs Cross Reactors Other (See Comments)    Blood will not clot, runs very thin; bruises    Signed: Morton Peters, M.D., F.A.C.S. Gastro[facility] [address] [place], [place] [address] [phone] Main / Paging   04/10/2016, 10:04 AM

## 2016-08-08 ENCOUNTER — Encounter: Payer: Self-pay | Admitting: Behavioral Health

## 2016-08-08 ENCOUNTER — Telehealth: Payer: Self-pay | Admitting: Behavioral Health

## 2016-08-08 NOTE — Telephone Encounter (Signed)
Pre-Visit Call completed with patient and chart updated.   Pre-Visit Info documented in Specialty Comments under [employer].    

## 2016-08-08 NOTE — Addendum Note (Signed)
Addended by: Harold BarbanBYRD, RONECIA E on: 08/08/2016 02:21 PM   Modules accepted: Orders

## 2016-08-08 NOTE — Telephone Encounter (Signed)
Unable to reach patient at time of Pre-Visit Call.  Left message for patient to return call when available.    

## 2016-08-09 ENCOUNTER — Ambulatory Visit (INDEPENDENT_AMBULATORY_CARE_PROVIDER_SITE_OTHER): Payer: BLUE CROSS/BLUE SHIELD | Admitting: Medical

## 2016-08-09 ENCOUNTER — Encounter: Payer: Self-pay | Admitting: Medical

## 2016-08-09 VITALS — BP 110/69 | HR 72 | Temp 98.1°F | Resp 16 | Ht 67.0 in | Wt 213.6 lb

## 2016-08-09 DIAGNOSIS — L309 Dermatitis, unspecified: Secondary | ICD-10-CM

## 2016-08-09 DIAGNOSIS — L739 Follicular disorder, unspecified: Secondary | ICD-10-CM

## 2016-08-09 DIAGNOSIS — F419 Anxiety disorder, unspecified: Secondary | ICD-10-CM

## 2016-08-09 MED ORDER — SERTRALINE HCL 100 MG PO TABS: 100.0000 mg | ORAL_TABLET | Freq: Every day | ORAL | 1 refills | Status: DC

## 2016-08-09 MED ORDER — BUSPIRONE HCL 7.5 MG PO TABS: ORAL_TABLET | ORAL | 0 refills | Status: DC

## 2016-08-09 MED ORDER — CLOTRIMAZOLE-BETAMETHASONE 1-0.05 % EX CREA: 1.0000 "application " | TOPICAL_CREAM | Freq: Two times a day (BID) | CUTANEOUS | 0 refills | Status: DC

## 2016-08-09 MED ORDER — CEPHALEXIN 500 MG PO CAPS: 500.0000 mg | ORAL_CAPSULE | Freq: Two times a day (BID) | ORAL | 0 refills | Status: DC

## 2016-08-09 NOTE — Patient Instructions (Addendum)
For anxiety continue sertraline 100 mg a day. I am adding buspar. Will see how you do with this.  Stop xanax presently as we discuss.   For folliculitis keflex antibiotic.  For thick inflamed area on left arm use lotrisone twice daily for 7-10 days.  Follow up Aug 24, 2016 for CPE. Or as needed

## 2016-08-09 NOTE — Progress Notes (Signed)
Subjective:    Patient ID: Kaitlin Jenkins, female    DOB: Aug 17, 1970, 46 y.o.   MRN: [id number]  HPI   I have reviewed pt PMH, PSH, FH, Social History and Surgical History.  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 She had been on it for 2 years. Pt has been on meds in  the past periodically. For numerous years. At one point about 4-5 years ago was on sertrline 200 mg a day. But she had stopped that in past. Overall describes hx of on and off use of meds in the past.  Pt states paxil made brief black out type episodes for 2-3 seconds. Describes was in a daze and she felt had no emotion.   In past lexapro caused extreme weight gain.   Pt has extreme stress related to work.  Pt states xanax in the past. It helps her sleep. Very rare during the day will she use xanax during the day. Makes her too lethargic during the day.  Pt states states she has very stressful job. 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   Pt never used effexor in the past. Also she never used buspar..  Scattered follicle on both arms for 2 weeks. Some scabs. In past year ago used peroxide and cortisone. It went away. Pt does not suspect allergic reaction. No history of skin infections.  LMP- hysterectomy.   Review of Systems  Constitutional: Negative for chills, diaphoresis and fatigue.  Respiratory: Negative for cough, chest tightness and wheezing.   Cardiovascular: Negative for chest pain and palpitations.  Gastrointestinal: Negative for abdominal pain.  Genitourinary: Negative for dysuria, flank pain and frequency.  Musculoskeletal: Negative for back pain.  Skin: Negative for rash.       Follicles inflammed and some scabs.  Neurological: Negative for dizziness and headaches.  Hematological: Negative for adenopathy. Does not bruise/bleed easily.  Psychiatric/Behavioral: Positive for sleep disturbance. Negative for agitation, confusion, dysphoric mood, hallucinations and suicidal ideas. The  patient is nervous/anxious.     Past Medical History:  Diagnosis Date  . Anemia    Mild normocytic anemia  . Anxiety   . Bacterial vaginosis   . Dysmenorrhea   . Endometriosis    history of endometriosis  . Gastroesophageal reflux disease   . H/O tubal ligation   . History of depression   . History of frequent urinary tract infections   . Pelvic pain   . Pyelonephritis    Right pyelonephritis  . Sepsis(995.91)      Social History   Social History  . Marital status: Married    Spouse name: N/A  . Number of children: N/A  . Years of education: N/A   Occupational History  . Not on file.   Social History Main Topics  . Smoking status: Former Games developermoker  . Smokeless tobacco: Never Used  . Alcohol use No  . Drug use: No  . Sexual activity: Not on file   Other Topics Concern  . Not on file   Social History Narrative  . No narrative on file    Past Surgical History:  Procedure Laterality Date  . ABDOMINAL HYSTERECTOMY    . CESAREAN SECTION    . LAPAROSCOPIC CHOLECYSTECTOMY SINGLE SITE WITH INTRAOPERATIVE CHOLANGIOGRAM N/A 04/07/2016   Procedure: LAPAROSCOPIC CHOLECYSTECTOMY SINGLE SITE WITH INTRAOPERATIVE CHOLANGIOGRAM;  Surgeon: Karie SodaSteven Gross, MD;  Location: [facility];  Service: General;  Laterality: N/A;  . OTHER SURGICAL HISTORY     Right  breast cyst removal--Right ovarian cyst excision  . VAGINAL HYSTERECTOMY     with left salpingo-oophorectomy    Family History  Problem Relation Age of Onset  . Hypertension Mother   . Mitral valve prolapse Father     Allergies  Allergen Reactions  . Morphine And Related Nausea And Vomiting  . Ciprofloxacin Swelling  . Sulfa Drugs Cross Reactors Other (See Comments)    Blood will not clot, runs very thin; bruises    Current Outpatient Prescriptions on File Prior to Visit  Medication Sig Dispense Refill  . ALPRAZolam (XANAX) 0.5 MG tablet Take 0.5 mg by mouth at bedtime as needed for sleep.     . B Complex Vitamins  (VITAMIN B COMPLEX PO) Take 1 tablet by mouth.    Marland Kitchen CALCIUM PO Take 1 tablet by mouth daily.    . Cholecalciferol (VITAMIN D3) 5000 units CAPS Take 10,000 Units by mouth daily.    Marland Kitchen estradiol (ESTRACE) 0.1 MG/GM vaginal cream Place 3 g vaginally once a week. wednesdays    . Multiple Vitamins-Minerals (MULTIVITAMIN WITH MINERALS) tablet Take 1 tablet by mouth daily.    . OMEGA-3 FATTY ACIDS PO Take 1 capsule by mouth.    . Probiotic Product (PROBIOTIC PO) Take 1 capsule by mouth daily.    . sertraline (ZOLOFT) 100 MG tablet Take 100 mg by mouth at bedtime.   2   No current facility-administered medications on file prior to visit.     BP 110/69 (BP Location: Left Arm, Patient Position: Sitting, Cuff Size: Large)   Pulse 72   Temp 98.1 F (36.7 C) (Oral)   Resp 16   Ht 5\' 7"  (1.702 m)   Wt 213 lb 9.6 oz (96.9 kg)   SpO2 97%   BMI 33.45 kg/m      Objective:   Physical Exam  General Mental Status- Alert. General Appearance- Not in acute distress.   Skin Scattered follicles inflamed on both tricep areas. Some scabs scattered as well.(both triceps). On her left tricep 1 cm x 2 cm mild dry, thick lichinified feel to area.   Neck Carotid Arteries- Normal color. Moisture- Normal Moisture. No carotid bruits. No JVD.  Chest and Lung Exam Auscultation: Breath Sounds:-Normal.  Cardiovascular Auscultation:Rythm- Regular. Murmurs & Other Heart Sounds:Auscultation of the heart reveals- No Murmurs.  Abdomen Inspection:-Inspeection Normal. Palpation/Percussion:Note:No mass. Palpation and Percussion of the abdomen reveal- Non Tender, Non Distended + BS, no rebound or guarding.    Neurologic Cranial Nerve exam:- CN III-XII intact(No nystagmus), symmetric smile. tStrength:- 5/5 equal and symmetric strength both upper and lower extremities.     Assessment & Plan:  For anxiety continue sertraline 100 mg a day. I am adding buspar. Will see how you do with this.  Stop xanax presently as  we discuss.   For folliculitis keflex antibiotic.  For thick inflamed area on left arm use lotrisone twice daily for 7-10 days.  Follow up Aug 24, 2016 for CPE. Or as needed  Esperanza Richters, PA-C

## 2016-08-24 ENCOUNTER — Ambulatory Visit (INDEPENDENT_AMBULATORY_CARE_PROVIDER_SITE_OTHER): Payer: BLUE CROSS/BLUE SHIELD | Admitting: Medical

## 2016-08-24 VITALS — BP 100/68 | HR 68 | Temp 97.9°F | Resp 16 | Wt 216.2 lb

## 2016-08-24 DIAGNOSIS — E559 Vitamin D deficiency, unspecified: Secondary | ICD-10-CM | POA: Diagnosis not present

## 2016-08-24 DIAGNOSIS — Z0000 Encounter for general adult medical examination without abnormal findings: Secondary | ICD-10-CM

## 2016-08-24 DIAGNOSIS — R7989 Other specified abnormal findings of blood chemistry: Secondary | ICD-10-CM

## 2016-08-24 LAB — CBC WITH DIFFERENTIAL/PLATELET
BASOS ABS: 0 10*3/uL (ref 0.0–0.1)
Basophils Relative: 0.6 % (ref 0.0–3.0)
EOS ABS: 0.1 10*3/uL (ref 0.0–0.7)
EOS PCT: 1.9 % (ref 0.0–5.0)
HCT: 40.4 % (ref 36.0–46.0)
HEMOGLOBIN: 13.5 g/dL (ref 12.0–15.0)
LYMPHS ABS: 2.3 10*3/uL (ref 0.7–4.0)
Lymphocytes Relative: 32.2 % (ref 12.0–46.0)
MCHC: 33.4 g/dL (ref 30.0–36.0)
MCV: 87.1 fl (ref 78.0–100.0)
MONO ABS: 0.5 10*3/uL (ref 0.1–1.0)
Monocytes Relative: 7.3 % (ref 3.0–12.0)
NEUTROS PCT: 58 % (ref 43.0–77.0)
Neutro Abs: 4.2 10*3/uL (ref 1.4–7.7)
Platelets: 280 10*3/uL (ref 150.0–400.0)
RBC: 4.63 Mil/uL (ref 3.87–5.11)
RDW: 13.5 % (ref 11.5–15.5)
WBC: 7.2 10*3/uL (ref 4.0–10.5)

## 2016-08-24 LAB — COMPREHENSIVE METABOLIC PANEL
ALBUMIN: 4.4 g/dL (ref 3.5–5.2)
ALK PHOS: 87 U/L (ref 39–117)
ALT: 19 U/L (ref 0–35)
AST: 16 U/L (ref 0–37)
BUN: 16 mg/dL (ref 6–23)
CO2: 28 mEq/L (ref 19–32)
CREATININE: 0.79 mg/dL (ref 0.40–1.20)
Calcium: 9.5 mg/dL (ref 8.4–10.5)
Chloride: 108 mEq/L (ref 96–112)
GFR: 83.83 mL/min (ref >60.00)
GLUCOSE: 98 mg/dL (ref 70–99)
POTASSIUM: 4.2 meq/L (ref 3.5–5.1)
SODIUM: 141 meq/L (ref 135–145)
TOTAL PROTEIN: 7.3 g/dL (ref 6.0–8.3)
Total Bilirubin: 0.3 mg/dL (ref 0.2–1.2)

## 2016-08-24 LAB — POC URINALSYSI DIPSTICK (AUTOMATED)
BILIRUBIN UA: NEGATIVE
Glucose, UA: NEGATIVE
KETONES UA: NEGATIVE
LEUKOCYTES UA: NEGATIVE
NITRITE UA: NEGATIVE
PH UA: 6 (ref 5.0–8.0)
PROTEIN UA: NEGATIVE
RBC UA: NEGATIVE
Spec Grav, UA: 1.025 (ref 1.010–1.025)
Urobilinogen, UA: 0.2 E.U./dL

## 2016-08-24 LAB — LIPID PANEL
CHOLESTEROL: 197 mg/dL (ref 0–200)
HDL: 55.2 mg/dL (ref >39.00)
LDL Cholesterol: 123 mg/dL — ABNORMAL HIGH (ref 0–99)
NONHDL: 141.49
Total CHOL/HDL Ratio: 4
Triglycerides: 93 mg/dL (ref 0.0–149.0)
VLDL: 18.6 mg/dL (ref 0.0–40.0)

## 2016-08-24 LAB — HIV ANTIBODY (ROUTINE TESTING W REFLEX): HIV: NONREACTIVE

## 2016-08-24 MED ORDER — BUSPIRONE HCL 7.5 MG PO TABS: ORAL_TABLET | ORAL | 3 refills | Status: DC

## 2016-08-24 NOTE — Progress Notes (Signed)
Subjective:    Patient ID: Kaitlin Jenkins, female    DOB: [birth year], 46 y.o.   MRN: [id number]  HPI  Pt is her for physical. She is fasting.  Appears to need tetanus update. Pt decline both tdap. She is made aware of benefit. Expresses concern for side effects.  Pt ok with getting hiv screening.   Paps ear due 2020. She states will see gyn in June, 2018. Pt will get her mammogram this year.  Pt not exercising. She is eating healthy. Has one child.(married)    Review of Systems  Constitutional: Negative for chills, fatigue and fever.  HENT: Negative for congestion, ear pain, facial swelling, nosebleeds, postnasal drip, sinus pain, sinus pressure, sore throat and trouble swallowing.   Respiratory: Negative for cough, chest tightness, shortness of breath and wheezing.   Cardiovascular: Negative for chest pain and palpitations.  Gastrointestinal: Negative for abdominal pain and rectal pain.  Genitourinary: Negative for dyspareunia, dysuria, frequency and genital sores.  Musculoskeletal: Negative for back pain, joint swelling, myalgias and neck stiffness.  Skin: Negative for rash.  Neurological: Negative for dizziness, seizures, speech difficulty, weakness, numbness and headaches.  Hematological: Negative for adenopathy. Does not bruise/bleed easily.  Psychiatric/Behavioral: Negative for behavioral problems and suicidal ideas. The patient is nervous/anxious.        Pt states buspar stops anxiety. Only takes one tab a day.    Past Medical History:  Diagnosis Date  . Anemia    Mild normocytic anemia  . Anxiety   . Bacterial vaginosis   . Dysmenorrhea   . Endometriosis    history of endometriosis  . Gastroesophageal reflux disease   . H/O tubal ligation   . History of depression   . History of frequent urinary tract infections   . Pelvic pain   . Pyelonephritis    Right pyelonephritis  . Sepsis(995.91)      Social History   Social History  . Marital status: Married    Spouse name: N/A  . Number of children: N/A  . Years of education: N/A   Occupational History  . Not on file.   Social History Main Topics  . Smoking status: Former Games developer  . Smokeless tobacco: Never Used  . Alcohol use No  . Drug use: No  . Sexual activity: Not on file   Other Topics Concern  . Not on file   Social History Narrative  . No narrative on file    Past Surgical History:  Procedure Laterality Date  . ABDOMINAL HYSTERECTOMY    . CESAREAN SECTION    . LAPAROSCOPIC CHOLECYSTECTOMY SINGLE SITE WITH INTRAOPERATIVE CHOLANGIOGRAM N/A 04/07/2016   Procedure: LAPAROSCOPIC CHOLECYSTECTOMY SINGLE SITE WITH INTRAOPERATIVE CHOLANGIOGRAM;  Surgeon: Karie Soda, MD;  Location: [facility];  Service: General;  Laterality: N/A;  . OTHER SURGICAL HISTORY     Right breast cyst removal--Right ovarian cyst excision  . VAGINAL HYSTERECTOMY     with left salpingo-oophorectomy    Family History  Problem Relation Age of Onset  . Hypertension Mother   . Mitral valve prolapse Father     Allergies  Allergen Reactions  . Morphine And Related Nausea And Vomiting  . Ciprofloxacin Swelling  . Sulfa Drugs Cross Reactors Other (See Comments)    Blood will not clot, runs very thin; bruises    Current Outpatient Prescriptions on File Prior to Visit  Medication Sig Dispense Refill  . B Complex Vitamins (VITAMIN B COMPLEX PO) Take 1 tablet by mouth.    Marland Kitchen  busPIRone (BUSPAR) 7.5 MG tablet 1 tab po bid 30 tablet 0  . CALCIUM PO Take 1 tablet by mouth daily.    . cephALEXin (KEFLEX) 500 MG capsule Take 1 capsule (500 mg total) by mouth 2 (two) times daily. 20 capsule 0  . Cholecalciferol (VITAMIN D3) 5000 units CAPS Take 10,000 Units by mouth daily.    . clotrimazole-betamethasone (LOTRISONE) cream Apply 1 application topically 2 (two) times daily. 30 g 0  . Multiple Vitamins-Minerals (MULTIVITAMIN WITH MINERALS) tablet Take 1 tablet by mouth daily.    . OMEGA-3 FATTY ACIDS PO Take 1 capsule by  mouth.    . Probiotic Product (PROBIOTIC PO) Take 1 capsule by mouth daily.    . sertraline (ZOLOFT) 100 MG tablet Take 1 tablet (100 mg total) by mouth at bedtime. 30 tablet 1   No current facility-administered medications on file prior to visit.     BP 100/68 (BP Location: Left Arm, Patient Position: Sitting, Cuff Size: Normal)   Pulse 68   Temp 97.9 F (36.6 C) (Oral)   Resp 16   Wt 216 lb 3.2 oz (98.1 kg)   SpO2 100%   BMI 33.86 kg/m       Objective:   Physical Exam   General  Mental Status - Alert. General Appearance - Well groomed. Not in acute distress.   HEENT Head- Normal. Ear Auditory Canal - Left- Normal. Right - Normal.Tympanic Membrane- Left- Normal. Right- Normal. Eye Sclera/Conjunctiva- Left- Normal. Right- Normal. Nose & Sinuses Nasal Mucosa- Left-   Not Boggy and Congested. Right- Not  Boggy and  Congested.Bilateral no axillary and no frontal sinus pressure. Mouth & Throat Lips: Upper Lip- Normal: no dryness, cracking, pallor, cyanosis, or vesicular eruption. Lower Lip-Normal: no dryness, cracking, pallor, cyanosis or vesicular eruption. Buccal Mucosa- Bilateral- No Aphthous ulcers. Oropharynx- No Discharge or Erythema. Tonsils: Characteristics- Bilateral- No Erythema or Congestion. Size/Enlargement- Bilateral- No enlargement. Discharge- bilateral-None.  Lymphatic Head & Neck General Head & Neck Lymphatics: Bilateral: Description- No Localized lymphadenopathy.  Skin On inspection of back on worrisome lesions. Pt inflammed follicles on arms looked about 70% better post doxy. Some scars apparent.  Neck Carotid Arteries- Normal color. Moisture- Normal Moisture. No carotid bruits. No JVD.  Chest and Lung Exam Auscultation: Breath Sounds:-Normal.  Cardiovascular Auscultation:Rythm- Regular. Murmurs & Other Heart Sounds:Auscultation of the heart reveals- No Murmurs.  Abdomen Inspection:-Inspeection Normal. Palpation/Percussion:Note:No mass.  Palpation and Percussion of the abdomen reveal- Non Tender, Non Distended + BS, no rebound or guarding.  Neurologic Cranial Nerve exam:- CN III-XII intact(No nystagmus), symmetric smile. Strength:- 5/5 equal and symmetric strength both upper and lower extremities.    Assessment & Plan:  For you wellness exam today I have ordered cbc, cmp, lipid panel, ua and hiv.(I explained could add tsh but she declined stating no fatigue. Explained new rules  regarding insurance not covering screening tsh). Pt declines testing tsh.  Vaccine tdap  declined today.  Recommend exercise and healthy diet.  We will let you know lab results as they come in.  Follow up date appointment will be determined after lab review.   For low vitamin D hx will get level today.  Macyn Remmert, Ramon DredgeEdward, PA-C

## 2016-08-24 NOTE — Patient Instructions (Addendum)
For you wellness exam today I have ordered cbc, cmp, lipid panel, ua and hiv.  Vaccine  tdap declined today.  Recommend exercise and healthy diet.  We will let you know lab results as they come in.  Follow up date appointment will be determined after lab review.   For low vitamin D hx will get level today.   Preventive Care 18-39 Years, Female Preventive care refers to lifestyle choices and visits with your health care provider that can promote health and wellness. What does preventive care include?  A yearly physical exam. This is also called an annual well check.  Dental exams once or twice a year.  Routine eye exams. Ask your health care provider how often you should have your eyes checked.  Personal lifestyle choices, including: ? Daily care of your teeth and gums. ? Regular physical activity. ? Eating a healthy diet. ? Avoiding tobacco and drug use. ? Limiting alcohol use. ? Practicing safe sex. ? Taking vitamin and mineral supplements as recommended by your health care provider. What happens during an annual well check? The services and screenings done by your health care provider during your annual well check will depend on your age, overall health, lifestyle risk factors, and family history of disease. Counseling Your health care provider may ask you questions about your:  Alcohol use.  Tobacco use.  Drug use.  Emotional well-being.  Home and relationship well-being.  Sexual activity.  Eating habits.  Work and work Statistician.  Method of birth control.  Menstrual cycle.  Pregnancy history.  Screening You may have the following tests or measurements:  Height, weight, and BMI.  Diabetes screening. This is done by checking your blood sugar (glucose) after you have not eaten for a while (fasting).  Blood pressure.  Lipid and cholesterol levels. These may be checked every 5 years starting at age 57.  Skin check.  Hepatitis C blood  test.  Hepatitis B blood test.  Sexually transmitted disease (STD) testing.  BRCA-related cancer screening. This may be done if you have a family history of breast, ovarian, tubal, or peritoneal cancers.  Pelvic exam and Pap test. This may be done every 3 years starting at age 50. Starting at age 29, this may be done every 5 years if you have a Pap test in combination with an HPV test.  Discuss your test results, treatment options, and if necessary, the need for more tests with your health care provider. Vaccines Your health care provider may recommend certain vaccines, such as:  Influenza vaccine. This is recommended every year.  Tetanus, diphtheria, and acellular pertussis (Tdap, Td) vaccine. You may need a Td booster every 10 years.  Varicella vaccine. You may need this if you have not been vaccinated.  HPV vaccine. If you are 67 or younger, you may need three doses over 6 months.  Measles, mumps, and rubella (MMR) vaccine. You may need at least one dose of MMR. You may also need a second dose.  Pneumococcal 13-valent conjugate (PCV13) vaccine. You may need this if you have certain conditions and were not previously vaccinated.  Pneumococcal polysaccharide (PPSV23) vaccine. You may need one or two doses if you smoke cigarettes or if you have certain conditions.  Meningococcal vaccine. One dose is recommended if you are age 21-21 years and a first-year college student living in a residence hall, or if you have one of several medical conditions. You may also need additional booster doses.  Hepatitis A vaccine. You may need  this if you have certain conditions or if you travel or work in places where you may be exposed to hepatitis A.  Hepatitis B vaccine. You may need this if you have certain conditions or if you travel or work in places where you may be exposed to hepatitis B.  Haemophilus influenzae type b (Hib) vaccine. You may need this if you have certain risk factors.  Talk  to your health care provider about which screenings and vaccines you need and how often you need them. This information is not intended to replace advice given to you by your health care provider. Make sure you discuss any questions you have with your health care provider. Document Released: 05/09/2001 Document Revised: 12/01/2015 Document Reviewed: 01/12/2015 Elsevier Interactive Patient Education  2017 [employer].

## 2016-08-26 LAB — VITAMIN D 1,25 DIHYDROXY
VITAMIN D3 1, 25 (OH): 33 pg/mL
Vitamin D 1, 25 (OH)2 Total: 33 pg/mL (ref 18–72)
Vitamin D2 1, 25 (OH)2: <8 pg/mL

## 2016-10-07 ENCOUNTER — Other Ambulatory Visit: Payer: Self-pay | Admitting: Medical

## 2016-10-10 DIAGNOSIS — L03115 Cellulitis of right lower limb: Secondary | ICD-10-CM | POA: Diagnosis not present

## 2016-11-07 ENCOUNTER — Other Ambulatory Visit: Payer: Self-pay | Admitting: Medical

## 2016-12-21 ENCOUNTER — Ambulatory Visit: Payer: BLUE CROSS/BLUE SHIELD | Admitting: Medical

## 2016-12-21 DIAGNOSIS — Z0289 Encounter for other administrative examinations: Secondary | ICD-10-CM

## 2016-12-29 ENCOUNTER — Ambulatory Visit (INDEPENDENT_AMBULATORY_CARE_PROVIDER_SITE_OTHER): Payer: BLUE CROSS/BLUE SHIELD | Admitting: Medical

## 2016-12-29 VITALS — BP 110/70 | HR 70 | Temp 98.1°F | Resp 16 | Ht 67.0 in | Wt 224.2 lb

## 2016-12-29 DIAGNOSIS — L739 Follicular disorder, unspecified: Secondary | ICD-10-CM

## 2016-12-29 DIAGNOSIS — F419 Anxiety disorder, unspecified: Secondary | ICD-10-CM | POA: Diagnosis not present

## 2016-12-29 MED ORDER — BUSPIRONE HCL 15 MG PO TABS: ORAL_TABLET | ORAL | 3 refills | Status: DC

## 2016-12-29 MED ORDER — SERTRALINE HCL 100 MG PO TABS: ORAL_TABLET | ORAL | 5 refills | Status: DC

## 2016-12-29 MED ORDER — MUPIROCIN 2 % EX OINT: TOPICAL_OINTMENT | CUTANEOUS | 2 refills | Status: DC

## 2016-12-29 NOTE — Progress Notes (Signed)
Subjective:    Patient ID: Kaitlin Jenkins, female    DOB: [birth year], 46 y.o.   MRN: [id number]  HPI   Pt in for follow up. She states overall she feel a lot better. Anxiety is better controlled. She states at night buspar helps her sleep.  Pt vitamin d level was in normal range. She take daily otc.  Pt also uses topical antibiotic for early onset skin infections. Med I rx'd helped a lot.  Pt declines flu vaccine.   Review of Systems  Constitutional: Negative for chills, diaphoresis, fatigue and fever.  HENT: Negative for congestion, ear pain, facial swelling, hearing loss, postnasal drip, sinus pressure, sneezing and sore throat.   Respiratory: Negative for chest tightness, shortness of breath and stridor.   Cardiovascular: Negative for chest pain and palpitations.  Gastrointestinal: Negative for abdominal pain.  Skin:       Hx of skin follicultis.  Neurological: Negative for dizziness, numbness and headaches.  Hematological: Negative for adenopathy. Does not bruise/bleed easily.  Psychiatric/Behavioral: Negative for behavioral problems, confusion, dysphoric mood, self-injury and suicidal ideas. The patient is nervous/anxious.        Controlled recently but thinks would get some benefit with increase of BuSpar at night.    Past Medical History:  Diagnosis Date  . Anemia    Mild normocytic anemia  . Anxiety   . Bacterial vaginosis   . Dysmenorrhea   . Endometriosis    history of endometriosis  . Gastroesophageal reflux disease   . H/O tubal ligation   . History of depression   . History of frequent urinary tract infections   . Pelvic pain   . Pyelonephritis    Right pyelonephritis  . Sepsis(995.91)      Social History   Social History  . Marital status: Married    Spouse name: N/A  . Number of children: N/A  . Years of education: N/A   Occupational History  . Not on file.   Social History Main Topics  . Smoking status: Former Games developer  . Smokeless tobacco:  Never Used  . Alcohol use No  . Drug use: No  . Sexual activity: Not on file   Other Topics Concern  . Not on file   Social History Narrative  . No narrative on file    Past Surgical History:  Procedure Laterality Date  . ABDOMINAL HYSTERECTOMY    . CESAREAN SECTION    . LAPAROSCOPIC CHOLECYSTECTOMY SINGLE SITE WITH INTRAOPERATIVE CHOLANGIOGRAM N/A 04/07/2016   Procedure: LAPAROSCOPIC CHOLECYSTECTOMY SINGLE SITE WITH INTRAOPERATIVE CHOLANGIOGRAM;  Surgeon: Karie Soda, MD;  Location: [facility];  Service: General;  Laterality: N/A;  . OTHER SURGICAL HISTORY     Right breast cyst removal--Right ovarian cyst excision  . VAGINAL HYSTERECTOMY     with left salpingo-oophorectomy    Family History  Problem Relation Age of Onset  . Hypertension Mother   . Mitral valve prolapse Father     Allergies  Allergen Reactions  . Morphine And Related Nausea And Vomiting  . Ciprofloxacin Swelling  . Sulfa Drugs Cross Reactors Other (See Comments)    Blood will not clot, runs very thin; bruises    Current Outpatient Prescriptions on File Prior to Visit  Medication Sig Dispense Refill  . B Complex Vitamins (VITAMIN B COMPLEX PO) Take 1 tablet by mouth.    . busPIRone (BUSPAR) 7.5 MG tablet 1 tab po bid 30 tablet 0  . busPIRone (BUSPAR) 7.5 MG tablet 1 tab po q  day 30 tablet 3  . CALCIUM PO Take 1 tablet by mouth daily.    . cephALEXin (KEFLEX) 500 MG capsule Take 1 capsule (500 mg total) by mouth 2 (two) times daily. 20 capsule 0  . Cholecalciferol (VITAMIN D3) 5000 units CAPS Take 10,000 Units by mouth daily.    . clotrimazole-betamethasone (LOTRISONE) cream Apply 1 application topically 2 (two) times daily. 30 g 0  . Multiple Vitamins-Minerals (MULTIVITAMIN WITH MINERALS) tablet Take 1 tablet by mouth daily.    . OMEGA-3 FATTY ACIDS PO Take 1 capsule by mouth.    . Probiotic Product (PROBIOTIC PO) Take 1 capsule by mouth daily.    . sertraline (ZOLOFT) 100 MG tablet TAKE 1 TABLET(100 MG)  BY MOUTH AT BEDTIME 30 tablet 0   No current facility-administered medications on file prior to visit.     BP 93/76   Pulse 70   Temp 98.1 F (36.7 C) (Oral)   Resp 16   Ht  (1.702 m)   Wt 224 lb 3.2 oz (101.7 kg)   SpO2 98%   BMI 35.11 kg/m      Objective:   Physical Exam  General Mental Status- Alert. General Appearance- Not in acute distress.   Skin Scattered small scars all over her arms from prior skin infection/folliculitis. No active current infection on review/inspection today.  Neck Carotid Arteries- Normal color. Moisture- Normal Moisture. No carotid bruits. No JVD.  Chest and Lung Exam Auscultation: Breath Sounds:-Normal.  Cardiovascular Auscultation:Rythm- Regular. Murmurs & Other Heart Sounds:Auscultation of the heart reveals- No Murmurs.  Neurologic Cranial Nerve exam:- CN III-XII intact(No nystagmus), symmetric smile. Strength:- 5/5 equal and symmetric strength both upper and lower extremities.      Assessment & Plan:  For your anxiety will increase your buspar to 15 mg and continue sertraline at the same dose. Overall anxiety much improved.  For skin folliculitis history I am prescribing mupirocin to use to area for early onset/presentation. If you apply area and region worsens or is tender then let us know and can see in office and give oral antibiotic.  Continue otc vitamin D to keep level up.  Follow up in 6 months or as needed  Crystalle Popwell, Ramon Dredge, [employer]

## 2016-12-29 NOTE — Patient Instructions (Addendum)
For your anxiety will increase your buspar to 15 mg and continue sertraline at the same dose. Overall anxiety much improved.  For skin folliculitis history I am prescribing mupirocin to use to area for early onset/presentation. If you apply area and region worsens or is tender then let us know and can see in office and give oral antibiotic.  Continue otc vitamin D to keep level up.  Follow up in 6 months or as needed

## 2017-01-17 ENCOUNTER — Other Ambulatory Visit: Payer: Self-pay | Admitting: Medical

## 2017-01-23 DIAGNOSIS — F411 Generalized anxiety disorder: Secondary | ICD-10-CM | POA: Diagnosis not present

## 2017-01-30 DIAGNOSIS — F411 Generalized anxiety disorder: Secondary | ICD-10-CM | POA: Diagnosis not present

## 2017-02-06 DIAGNOSIS — F411 Generalized anxiety disorder: Secondary | ICD-10-CM | POA: Diagnosis not present

## 2017-02-08 DIAGNOSIS — F411 Generalized anxiety disorder: Secondary | ICD-10-CM | POA: Diagnosis not present

## 2017-03-13 DIAGNOSIS — F411 Generalized anxiety disorder: Secondary | ICD-10-CM | POA: Diagnosis not present

## 2017-03-15 DIAGNOSIS — Z299 Encounter for prophylactic measures, unspecified: Secondary | ICD-10-CM | POA: Diagnosis not present

## 2017-03-15 DIAGNOSIS — F4323 Adjustment disorder with mixed anxiety and depressed mood: Secondary | ICD-10-CM | POA: Insufficient documentation

## 2017-03-15 DIAGNOSIS — N61 Mastitis without abscess: Secondary | ICD-10-CM | POA: Diagnosis not present

## 2017-03-29 DIAGNOSIS — N61 Mastitis without abscess: Secondary | ICD-10-CM | POA: Diagnosis not present

## 2017-04-02 DIAGNOSIS — R928 Other abnormal and inconclusive findings on diagnostic imaging of breast: Secondary | ICD-10-CM | POA: Diagnosis not present

## 2017-04-02 DIAGNOSIS — N611 Abscess of the breast and nipple: Secondary | ICD-10-CM | POA: Diagnosis not present

## 2017-04-02 DIAGNOSIS — N6002 Solitary cyst of left breast: Secondary | ICD-10-CM | POA: Diagnosis not present

## 2017-04-04 DIAGNOSIS — N611 Abscess of the breast and nipple: Secondary | ICD-10-CM | POA: Diagnosis not present

## 2017-04-04 DIAGNOSIS — N61 Mastitis without abscess: Secondary | ICD-10-CM | POA: Diagnosis not present

## 2017-04-04 DIAGNOSIS — L02412 Cutaneous abscess of left axilla: Secondary | ICD-10-CM | POA: Diagnosis not present

## 2017-04-10 ENCOUNTER — Encounter: Payer: Self-pay | Admitting: Medical

## 2017-04-10 ENCOUNTER — Ambulatory Visit (INDEPENDENT_AMBULATORY_CARE_PROVIDER_SITE_OTHER): Payer: BLUE CROSS/BLUE SHIELD | Admitting: Medical

## 2017-04-10 VITALS — BP 103/68 | HR 73 | Temp 98.0°F | Resp 16 | Ht 67.0 in | Wt 237.0 lb

## 2017-04-10 DIAGNOSIS — F419 Anxiety disorder, unspecified: Secondary | ICD-10-CM | POA: Diagnosis not present

## 2017-04-10 DIAGNOSIS — R1013 Epigastric pain: Secondary | ICD-10-CM

## 2017-04-10 DIAGNOSIS — K921 Melena: Secondary | ICD-10-CM

## 2017-04-10 LAB — COMPREHENSIVE METABOLIC PANEL
ALK PHOS: 93 U/L (ref 39–117)
ALT: 21 U/L (ref 0–35)
AST: 14 U/L (ref 0–37)
Albumin: 4.3 g/dL (ref 3.5–5.2)
BILIRUBIN TOTAL: 0.4 mg/dL (ref 0.2–1.2)
BUN: 17 mg/dL (ref 6–23)
CALCIUM: 9.7 mg/dL (ref 8.4–10.5)
CO2: 30 mEq/L (ref 19–32)
Chloride: 105 mEq/L (ref 96–112)
Creatinine, Ser: 0.83 mg/dL (ref 0.40–1.20)
GFR: 78.96 mL/min (ref >60.00)
Glucose, Bld: 108 mg/dL — ABNORMAL HIGH (ref 70–99)
Potassium: 4.3 mEq/L (ref 3.5–5.1)
Sodium: 142 mEq/L (ref 135–145)
TOTAL PROTEIN: 7.8 g/dL (ref 6.0–8.3)

## 2017-04-10 LAB — CBC WITH DIFFERENTIAL/PLATELET
Basophils Absolute: 0 10*3/uL (ref 0.0–0.1)
Basophils Relative: 0.6 % (ref 0.0–3.0)
EOS PCT: 4.3 % (ref 0.0–5.0)
Eosinophils Absolute: 0.3 10*3/uL (ref 0.0–0.7)
HEMATOCRIT: 40.3 % (ref 36.0–46.0)
Hemoglobin: 13.3 g/dL (ref 12.0–15.0)
LYMPHS PCT: 28.7 % (ref 12.0–46.0)
Lymphs Abs: 2 10*3/uL (ref 0.7–4.0)
MCHC: 32.9 g/dL (ref 30.0–36.0)
MCV: 85.9 fl (ref 78.0–100.0)
MONOS PCT: 8.6 % (ref 3.0–12.0)
Monocytes Absolute: 0.6 10*3/uL (ref 0.1–1.0)
NEUTROS ABS: 4 10*3/uL (ref 1.4–7.7)
Neutrophils Relative %: 57.8 % (ref 43.0–77.0)
PLATELETS: 314 10*3/uL (ref 150.0–400.0)
RBC: 4.7 Mil/uL (ref 3.87–5.11)
RDW: 14 % (ref 11.5–15.5)
WBC: 7 10*3/uL (ref 4.0–10.5)

## 2017-04-10 LAB — AMYLASE: AMYLASE: 26 U/L — AB (ref 27–131)

## 2017-04-10 LAB — LIPASE: LIPASE: 12 U/L (ref 11.0–59.0)

## 2017-04-10 MED ORDER — OMEPRAZOLE 20 MG PO CPDR: 20.0000 mg | DELAYED_RELEASE_CAPSULE | Freq: Every day | ORAL | 3 refills | Status: DC

## 2017-04-10 MED ORDER — HYDROCORTISONE ACETATE 25 MG RE SUPP: 25.0000 mg | Freq: Two times a day (BID) | RECTAL | 0 refills | Status: DC

## 2017-04-10 NOTE — Patient Instructions (Addendum)
For your recent abdomen pain possible reflux like, I will prescribe omeprazole and recommend healthy diet.  Continue your probiotic.  Also think it would be beneficial to get labs.  Those would include CBC,CMP, amylase, lipase and H. pylori breath test.  If your abdomen pain worsens or changes notify us.  If any severe pain then recommend ED evaluation.  For your bright red blood on toilet paper at times and some reported possibility in stools, I am going to give you Anusol HC suppository.  Exam was deferred today but we do need to be careful and watch closely.  Use Anusol for 7 days and then then turn in Ifob in 2 weeks.  If any gross blood, black appearing stool or microscopic blood found on Ifob test despite the above measures then would refer you back to your gastroenterologist.  Would recommend close follow-up as you do have polyp history.  Follow-up in 2 weeks or as needed.

## 2017-04-10 NOTE — Progress Notes (Signed)
Subjective:    Patient ID: Kaitlin Jenkins, female    DOB: 1970-10-04, 47 y.o.   MRN: [id number]  HPI  Pt in for evaluation.   Updates me left axillary abscess that required I and D around christmas. Was on clindamycin for about 14 days intermittently since around christmas.  Pt has some abdomen pain epigastric and pain just above the area after eating anything. Bad heart burn with eating various foods even healthy foods. Pt has been using tums and seems to help some. Pt had reflux years past only when she was pregnant. Has history of hysterectomy. Pt had stomach pain when she eats for about a week.  Pt not having any diarrhea.(normal/form stools 1-2 times a week) Not hard.  Pt has been seeing some bright red blood in toilet paper. Occasional some blood in toilet paper. No rectal itching, no burning or any known history of hemorrhoids. Then states maybe one hemorrhoid years ago when pregnant.   Brother- had some polyps determined precancerous. So Gi of hers decided for her to get colonoscopy every 5 years. Pt last study 2 years ago. Polyp were negative.    Review of Systems  Constitutional: Negative for chills, fatigue and fever.  HENT: Negative for congestion, drooling, ear discharge and facial swelling.   Respiratory: Negative for cough, chest tightness, shortness of breath and wheezing.   Cardiovascular: Negative for chest pain and palpitations.  Gastrointestinal: Positive for abdominal pain. Negative for abdominal distention, diarrhea, nausea and vomiting.       Some hematochezia.  See HPI.  Genitourinary: Negative for difficulty urinating, flank pain and frequency.  Musculoskeletal: Negative for back pain.  Skin: Negative for rash.  Neurological: Negative for dizziness, syncope, speech difficulty, weakness and headaches.  Hematological: Negative for adenopathy. Does not bruise/bleed easily.  Psychiatric/Behavioral: Negative for behavioral problems, confusion, sleep disturbance  and suicidal ideas. The patient is nervous/anxious.        Pt states sertraline and buspar very good. Controls her anxiety.    Past Medical History:  Diagnosis Date  . Anemia    Mild normocytic anemia  . Anxiety   . Bacterial vaginosis   . Dysmenorrhea   . Endometriosis    history of endometriosis  . Gastroesophageal reflux disease   . H/O tubal ligation   . History of depression   . History of frequent urinary tract infections   . Pelvic pain   . Pyelonephritis    Right pyelonephritis  . Sepsis(995.91)      Social History   Socioeconomic History  . Marital status: Married    Spouse name: Not on file  . Number of children: Not on file  . Years of education: Not on file  . Highest education level: Not on file  Social Needs  . Financial resource strain: Not on file  . Food insecurity - worry: Not on file  . Food insecurity - inability: Not on file  . Transportation needs - medical: Not on file  . Transportation needs - non-medical: Not on file  Occupational History  . Not on file  Tobacco Use  . Smoking status: Former Games developermoker  . Smokeless tobacco: Never Used  Substance and Sexual Activity  . Alcohol use: No  . Drug use: No  . Sexual activity: Not on file  Other Topics Concern  . Not on file  Social History Narrative  . Not on file    Past Surgical History:  Procedure Laterality Date  . ABDOMINAL HYSTERECTOMY    .  CESAREAN SECTION    . LAPAROSCOPIC CHOLECYSTECTOMY SINGLE SITE WITH INTRAOPERATIVE CHOLANGIOGRAM N/A 04/07/2016   Procedure: LAPAROSCOPIC CHOLECYSTECTOMY SINGLE SITE WITH INTRAOPERATIVE CHOLANGIOGRAM;  Surgeon: Karie Soda, MD;  Location: [facility];  Service: General;  Laterality: N/A;  . OTHER SURGICAL HISTORY     Right breast cyst removal--Right ovarian cyst excision  . VAGINAL HYSTERECTOMY     with left salpingo-oophorectomy    Family History  Problem Relation Age of Onset  . Hypertension Mother   . Mitral valve prolapse Father     Allergies   Allergen Reactions  . Morphine And Related Nausea And Vomiting  . Ciprofloxacin Swelling  . Sulfa Drugs Cross Reactors Other (See Comments)    Blood will not clot, runs very thin; bruises    Current Outpatient Medications on File Prior to Visit  Medication Sig Dispense Refill  . B Complex Vitamins (VITAMIN B COMPLEX PO) Take 1 tablet by mouth.    . busPIRone (BUSPAR) 15 MG tablet 1 tab po q hs as needed anxiety 30 tablet 3  . CALCIUM PO Take 1 tablet by mouth daily.    . Cholecalciferol (VITAMIN D3) 5000 units CAPS Take 10,000 Units by mouth daily.    . clotrimazole-betamethasone (LOTRISONE) cream Apply 1 application topically 2 (two) times daily. 30 g 0  . ESTRACE VAGINAL 0.1 MG/GM vaginal cream APPLY 2 GRAMS VAGINALLY TWICE A WEEK  8  . Multiple Vitamins-Minerals (MULTIVITAMIN WITH MINERALS) tablet Take 1 tablet by mouth daily.    . mupirocin ointment (BACTROBAN) 2 % Apply to area twice daily 22 g 2  . OMEGA-3 FATTY ACIDS PO Take 1 capsule by mouth.    . Probiotic Product (PROBIOTIC PO) Take 1 capsule by mouth daily.    . sertraline (ZOLOFT) 100 MG tablet TAKE 1 TABLET(100 MG) BY MOUTH AT BEDTIME 30 tablet 5   No current facility-administered medications on file prior to visit.     BP 103/68   Pulse 73   Temp 98 F (36.7 C) (Oral)   Resp 16   Ht 5\' 7"  (1.702 m)   Wt 237 lb (107.5 kg)   SpO2 97%   BMI 37.12 kg/m       Objective:   Physical Exam  General Appearance- Not in acute distress.  HEENT Eyes- Scleraeral/Conjuntiva-bilat- Not Yellow. Mouth & Throat- Normal.  Chest and Lung Exam Auscultation: Breath sounds:-Normal. Adventitious sounds:- No Adventitious sounds.  Cardiovascular Auscultation:Rythm - Regular. Heart Sounds -Normal heart sounds.  Abdomen Inspection:-Inspection Normal.  Palpation/Perucssion: Palpation and Percussion of the abdomen reveal- Non Tender, No Rebound tenderness, No rigidity(Guarding) and No Palpable abdominal masses.    Liver:-Normal.  Spleen:- Normal.   Rectal Anorectal Exam: Pt declined exam.      Assessment & Plan:  For your recent abdomen pain possible reflux like, I will prescribe omeprazole and recommend healthy diet.  Continue your probiotic.  Also think it would be beneficial to get labs.  Those would include CBC,CMP, amylase, lipase and H. pylori breath test.  If your abdomen pain worsens or changes notify us.  If any severe pain then recommend ED evaluation.  For your bright red blood on toilet paper at times and some reported possibility in stools, I am going to give you Anusol HC suppository.  Exam was deferred today but we doneed to be careful and watch closely.  Use Anusol for 7 days and then turn in Ifob in 2 weeks.  If any gross blood, black appearing stool or microscopic blood found  on Ifob test despite the above measures then would refer you back to your gastroenterologist.  Would recommend close follow-up as you do have polyp history.  Follow-up in 2 weeks or as needed.  Note patient had a friend today and her mood is very well controlled with current med regimen.  Nilan Iddings, Ramon Dredge, PA-C

## 2017-04-11 ENCOUNTER — Encounter: Payer: Self-pay | Admitting: Medical

## 2017-04-11 ENCOUNTER — Telehealth: Payer: Self-pay

## 2017-04-11 ENCOUNTER — Telehealth: Payer: Self-pay | Admitting: Medical

## 2017-04-11 LAB — H. PYLORI BREATH TEST: H. PYLORI BREATH TEST: NOT DETECTED

## 2017-04-11 NOTE — Telephone Encounter (Signed)
-----   Message from Esperanza RichtersEdward Saguier, PA-C sent at 04/10/2017  6:20 PM EST ----- Labs showed no elevation of pancreas enzymes.  Normal GFR and normal liver enzymes.  No elevated infection fighting cells and no anemia.  Sugar level is mildly elevated at 108.  We will see how abdomen pain response to omeprazole.  Bacteria breath test is pending.

## 2017-04-11 NOTE — Telephone Encounter (Signed)
Left message for patient to call back for this resuts. Ok for triage nurse at [facility] to give patient results.  

## 2017-04-11 NOTE — Telephone Encounter (Signed)
Pt called in stating that she is returning a call, however not showing a note in chart as to who called pt. Spoke with providers CMA, she did not call pt. Pt says to just return call if needed.    CB: [phone]

## 2017-04-11 NOTE — Telephone Encounter (Signed)
-----   Message from Esperanza RichtersEdward Saguier, PA-C sent at 04/11/2017  1:03 PM EST ----- Pt h pylori breath test was negative.

## 2017-04-11 NOTE — Telephone Encounter (Signed)
Left message for patient to call back for this resuts. [facility] to give [facility] results.

## 2017-04-12 ENCOUNTER — Ambulatory Visit: Payer: Self-pay | Admitting: Medical

## 2017-04-17 DIAGNOSIS — F411 Generalized anxiety disorder: Secondary | ICD-10-CM | POA: Diagnosis not present

## 2017-04-27 ENCOUNTER — Other Ambulatory Visit: Payer: Self-pay | Admitting: Medical

## 2017-05-01 DIAGNOSIS — F411 Generalized anxiety disorder: Secondary | ICD-10-CM | POA: Diagnosis not present

## 2017-06-06 ENCOUNTER — Other Ambulatory Visit: Payer: Self-pay | Admitting: Medical

## 2017-07-24 ENCOUNTER — Other Ambulatory Visit: Payer: Self-pay | Admitting: Medical

## 2017-08-02 DIAGNOSIS — L02211 Cutaneous abscess of abdominal wall: Secondary | ICD-10-CM | POA: Diagnosis not present

## 2017-08-02 DIAGNOSIS — L03311 Cellulitis of abdominal wall: Secondary | ICD-10-CM | POA: Diagnosis not present

## 2017-08-03 ENCOUNTER — Ambulatory Visit: Payer: BLUE CROSS/BLUE SHIELD | Admitting: Medical

## 2017-08-22 ENCOUNTER — Other Ambulatory Visit: Payer: Self-pay | Admitting: Medical

## 2017-09-14 DIAGNOSIS — F4320 Adjustment disorder, unspecified: Secondary | ICD-10-CM | POA: Diagnosis not present

## 2017-09-16 ENCOUNTER — Other Ambulatory Visit: Payer: Self-pay | Admitting: Medical

## 2017-09-20 DIAGNOSIS — F4320 Adjustment disorder, unspecified: Secondary | ICD-10-CM | POA: Diagnosis not present

## 2017-10-04 DIAGNOSIS — F4320 Adjustment disorder, unspecified: Secondary | ICD-10-CM | POA: Diagnosis not present

## 2017-10-12 ENCOUNTER — Other Ambulatory Visit: Payer: Self-pay | Admitting: Medical

## 2017-10-15 NOTE — Telephone Encounter (Signed)
Pt is due for follow up please call and schedule appointment.  

## 2017-10-16 NOTE — Telephone Encounter (Signed)
Called pt and scheduled an appt on 10-26-2017 for fu appt. Done

## 2017-10-17 DIAGNOSIS — F4320 Adjustment disorder, unspecified: Secondary | ICD-10-CM | POA: Diagnosis not present

## 2017-10-26 ENCOUNTER — Encounter: Payer: Self-pay | Admitting: Medical

## 2017-10-26 ENCOUNTER — Ambulatory Visit (INDEPENDENT_AMBULATORY_CARE_PROVIDER_SITE_OTHER): Payer: BLUE CROSS/BLUE SHIELD | Admitting: Medical

## 2017-10-26 VITALS — BP 105/57 | HR 70 | Temp 98.1°F | Resp 16 | Ht 67.0 in | Wt 245.6 lb

## 2017-10-26 DIAGNOSIS — F419 Anxiety disorder, unspecified: Secondary | ICD-10-CM

## 2017-10-26 NOTE — Progress Notes (Signed)
Subjective:    Patient ID: Kaitlin Jenkins, female    DOB: 1970/04/05, 47 y.o.   MRN: [id number]  HPI  Pt in for follow up on anxiety. She stats sometimes during the day anxiety has increased. Currently just using buspar at night and sertraline.   She tried 1 tab buspar in morning for anxiety and it made her too sleepy.  Pt does not want to use benzodiazepene.Sedated her excessively.  Pt never had hydroxyzine before.  Pt states she is not anxious during the day every day. But intermittent.  Really not feeling depressed.   Review of Systems  Constitutional: Negative for chills, fatigue and fever.  Respiratory: Negative for cough, chest tightness, shortness of breath and wheezing.   Cardiovascular: Negative for chest pain and palpitations.  Musculoskeletal: Negative for arthralgias and back pain.  Skin: Negative for rash.  Neurological: Negative for dizziness and headaches.  Hematological: Negative for adenopathy. Does not bruise/bleed easily.  Psychiatric/Behavioral: Negative for behavioral problems, confusion, dysphoric mood, sleep disturbance and suicidal ideas. The patient is nervous/anxious.     Past Medical History:  Diagnosis Date  . Anemia    Mild normocytic anemia  . Anxiety   . Bacterial vaginosis   . Dysmenorrhea   . Endometriosis    history of endometriosis  . Gastroesophageal reflux disease   . H/O tubal ligation   . History of depression   . History of frequent urinary tract infections   . Pelvic pain   . Pyelonephritis    Right pyelonephritis  . Sepsis(995.91)      Social History   Socioeconomic History  . Marital status: Married    Spouse name: Not on file  . Number of children: Not on file  . Years of education: Not on file  . Highest education level: Not on file  Occupational History  . Not on file  Social Needs  . Financial resource strain: Not on file  . Food insecurity:    Worry: Not on file    Inability: Not on file  . Transportation  needs:    Medical: Not on file    Non-medical: Not on file  Tobacco Use  . Smoking status: Former Games developermoker  . Smokeless tobacco: Never Used  Substance and Sexual Activity  . Alcohol use: No  . Drug use: No  . Sexual activity: Not on file  Lifestyle  . Physical activity:    Days per week: Not on file    Minutes per session: Not on file  . Stress: Not on file  Relationships  . Social connections:    Talks on phone: Not on file    Gets together: Not on file    Attends religious service: Not on file    Active member of club or organization: Not on file    Attends meetings of clubs or organizations: Not on file    Relationship status: Not on file  . Intimate partner violence:    Fear of current or ex partner: Not on file    Emotionally abused: Not on file    Physically abused: Not on file    Forced sexual activity: Not on file  Other Topics Concern  . Not on file  Social History Narrative  . Not on file    Past Surgical History:  Procedure Laterality Date  . ABDOMINAL HYSTERECTOMY    . CESAREAN SECTION    . LAPAROSCOPIC CHOLECYSTECTOMY SINGLE SITE WITH INTRAOPERATIVE CHOLANGIOGRAM N/A 04/07/2016   Procedure: LAPAROSCOPIC CHOLECYSTECTOMY SINGLE  SITE WITH INTRAOPERATIVE CHOLANGIOGRAM;  Surgeon: Karie Soda, MD;  Location: [facility];  Service: General;  Laterality: N/A;  . OTHER SURGICAL HISTORY     Right breast cyst removal--Right ovarian cyst excision  . VAGINAL HYSTERECTOMY     with left salpingo-oophorectomy    Family History  Problem Relation Age of Onset  . Hypertension Mother   . Mitral valve prolapse Father     Allergies  Allergen Reactions  . Morphine And Related Nausea And Vomiting  . Ciprofloxacin Swelling  . Sulfa Antibiotics Other (See Comments)    Anticoagulation problems and Bruising   . Sulfa Drugs Cross Reactors Other (See Comments)    Blood will not clot, runs very thin; bruises  . Tape Rash    Current Outpatient Medications on File Prior to Visit    Medication Sig Dispense Refill  . B Complex Vitamins (VITAMIN B COMPLEX PO) Take 1 tablet by mouth.    . busPIRone (BUSPAR) 15 MG tablet TAKE 1 TABLET BY MOUTH AT BEDTIME AS NEEDED FOR ANXIETY 30 tablet 0  . CALCIUM PO Take 1 tablet by mouth daily.    . Cholecalciferol (VITAMIN D3) 5000 units CAPS Take 10,000 Units by mouth daily.    . clotrimazole-betamethasone (LOTRISONE) cream Apply 1 application topically 2 (two) times daily. 30 g 0  . ESTRACE VAGINAL 0.1 MG/GM vaginal cream APPLY 2 GRAMS VAGINALLY TWICE A WEEK  8  . hydrocortisone (ANUSOL-HC) 25 MG suppository Place 1 suppository (25 mg total) rectally 2 (two) times daily. 14 suppository 0  . Multiple Vitamins-Minerals (MULTIVITAMIN WITH MINERALS) tablet Take 1 tablet by mouth daily.    . mupirocin ointment (BACTROBAN) 2 % Apply to area twice daily 22 g 2  . OMEGA-3 FATTY ACIDS PO Take 1 capsule by mouth.    Marland Kitchen omeprazole (PRILOSEC) 20 MG capsule Take 1 capsule (20 mg total) by mouth daily. 30 capsule 3  . Probiotic Product (PROBIOTIC PO) Take 1 capsule by mouth daily.    . sertraline (ZOLOFT) 100 MG tablet TAKE 1 TABLET(100 MG) BY MOUTH AT BEDTIME 30 tablet 0   No current facility-administered medications on file prior to visit.     BP (!) 105/57   Pulse 70   Temp 98.1 F (36.7 C) (Oral)   Resp 16   Ht 5\' 7"  (1.702 m)   Wt 245 lb 9.6 oz (111.4 kg)   SpO2 97%   BMI 38.47 kg/m       Objective:   Physical Exam  General Mental Status- Alert. General Appearance- Not in acute distress.    Chest and Lung Exam Auscultation: Breath Sounds:-Normal.  Cardiovascular Auscultation:Rythm- Regular. Murmurs & Other Heart Sounds:Auscultation of the heart reveals- No Murmurs.  Abdomen Inspection:-Inspeection Normal. Palpation/Percussion:Note:No mass. Palpation and Percussion of the abdomen reveal- Non Tender, Non Distended + BS, no rebound or guarding.  Neurologic Cranial Nerve exam:- CN III-XII intact(No nystagmus), symmetric  smile. Strength:- 5/5 equal and symmetric strength both upper and lower extremities.      Assessment & Plan:  For your recent anxiety during the day, I want you to continue sertraline and try to use your BuSpar 1/2 tablet in the morning and 1/2 tablet at night.  I think this might help and also avoid oversedation.  Please give me an update by my chart in 7 to 10 days.  If this regimen does not work then might need to consider other medication such as hydroxyzine.  Esperanza Richters, PA-C

## 2017-10-26 NOTE — Patient Instructions (Addendum)
For your recent anxiety during the day, I want you to continue sertraline and try to use your BuSpar 1/2 tablet in the morning and 1/2 tablet at night.  I think this might help and also avoid oversedation.  Please give me an update by my chart in 7 to 10 days.  If this regimen does not work then might need to consider adding other medication such as hydroxyzine.

## 2017-11-08 DIAGNOSIS — F4320 Adjustment disorder, unspecified: Secondary | ICD-10-CM | POA: Diagnosis not present

## 2017-11-15 ENCOUNTER — Other Ambulatory Visit: Payer: Self-pay | Admitting: Medical

## 2017-11-29 DIAGNOSIS — F4320 Adjustment disorder, unspecified: Secondary | ICD-10-CM | POA: Diagnosis not present

## 2017-12-05 DIAGNOSIS — M50323 Other cervical disc degeneration at C6-C7 level: Secondary | ICD-10-CM | POA: Diagnosis not present

## 2017-12-05 DIAGNOSIS — M531 Cervicobrachial syndrome: Secondary | ICD-10-CM | POA: Diagnosis not present

## 2017-12-05 DIAGNOSIS — M62838 Other muscle spasm: Secondary | ICD-10-CM | POA: Diagnosis not present

## 2017-12-05 DIAGNOSIS — M50322 Other cervical disc degeneration at C5-C6 level: Secondary | ICD-10-CM | POA: Diagnosis not present

## 2017-12-06 DIAGNOSIS — M50323 Other cervical disc degeneration at C6-C7 level: Secondary | ICD-10-CM | POA: Diagnosis not present

## 2017-12-06 DIAGNOSIS — M531 Cervicobrachial syndrome: Secondary | ICD-10-CM | POA: Diagnosis not present

## 2017-12-06 DIAGNOSIS — M62838 Other muscle spasm: Secondary | ICD-10-CM | POA: Diagnosis not present

## 2017-12-06 DIAGNOSIS — M50322 Other cervical disc degeneration at C5-C6 level: Secondary | ICD-10-CM | POA: Diagnosis not present

## 2017-12-10 DIAGNOSIS — M50322 Other cervical disc degeneration at C5-C6 level: Secondary | ICD-10-CM | POA: Diagnosis not present

## 2017-12-10 DIAGNOSIS — M62838 Other muscle spasm: Secondary | ICD-10-CM | POA: Diagnosis not present

## 2017-12-10 DIAGNOSIS — M50323 Other cervical disc degeneration at C6-C7 level: Secondary | ICD-10-CM | POA: Diagnosis not present

## 2017-12-10 DIAGNOSIS — M531 Cervicobrachial syndrome: Secondary | ICD-10-CM | POA: Diagnosis not present

## 2017-12-11 DIAGNOSIS — M531 Cervicobrachial syndrome: Secondary | ICD-10-CM | POA: Diagnosis not present

## 2017-12-11 DIAGNOSIS — M50322 Other cervical disc degeneration at C5-C6 level: Secondary | ICD-10-CM | POA: Diagnosis not present

## 2017-12-11 DIAGNOSIS — M50323 Other cervical disc degeneration at C6-C7 level: Secondary | ICD-10-CM | POA: Diagnosis not present

## 2017-12-11 DIAGNOSIS — M62838 Other muscle spasm: Secondary | ICD-10-CM | POA: Diagnosis not present

## 2017-12-12 DIAGNOSIS — M50323 Other cervical disc degeneration at C6-C7 level: Secondary | ICD-10-CM | POA: Diagnosis not present

## 2017-12-12 DIAGNOSIS — M50322 Other cervical disc degeneration at C5-C6 level: Secondary | ICD-10-CM | POA: Diagnosis not present

## 2017-12-12 DIAGNOSIS — M62838 Other muscle spasm: Secondary | ICD-10-CM | POA: Diagnosis not present

## 2017-12-12 DIAGNOSIS — M79671 Pain in right foot: Secondary | ICD-10-CM | POA: Diagnosis not present

## 2017-12-12 DIAGNOSIS — M531 Cervicobrachial syndrome: Secondary | ICD-10-CM | POA: Diagnosis not present

## 2017-12-12 DIAGNOSIS — M7989 Other specified soft tissue disorders: Secondary | ICD-10-CM | POA: Diagnosis not present

## 2017-12-13 DIAGNOSIS — M7989 Other specified soft tissue disorders: Secondary | ICD-10-CM | POA: Diagnosis not present

## 2017-12-13 DIAGNOSIS — F4320 Adjustment disorder, unspecified: Secondary | ICD-10-CM | POA: Diagnosis not present

## 2017-12-17 DIAGNOSIS — M531 Cervicobrachial syndrome: Secondary | ICD-10-CM | POA: Diagnosis not present

## 2017-12-17 DIAGNOSIS — M50322 Other cervical disc degeneration at C5-C6 level: Secondary | ICD-10-CM | POA: Diagnosis not present

## 2017-12-17 DIAGNOSIS — M50323 Other cervical disc degeneration at C6-C7 level: Secondary | ICD-10-CM | POA: Diagnosis not present

## 2017-12-17 DIAGNOSIS — M62838 Other muscle spasm: Secondary | ICD-10-CM | POA: Diagnosis not present

## 2017-12-18 DIAGNOSIS — M531 Cervicobrachial syndrome: Secondary | ICD-10-CM | POA: Diagnosis not present

## 2017-12-18 DIAGNOSIS — M62838 Other muscle spasm: Secondary | ICD-10-CM | POA: Diagnosis not present

## 2017-12-18 DIAGNOSIS — M50322 Other cervical disc degeneration at C5-C6 level: Secondary | ICD-10-CM | POA: Diagnosis not present

## 2017-12-18 DIAGNOSIS — M50323 Other cervical disc degeneration at C6-C7 level: Secondary | ICD-10-CM | POA: Diagnosis not present

## 2017-12-20 DIAGNOSIS — M50323 Other cervical disc degeneration at C6-C7 level: Secondary | ICD-10-CM | POA: Diagnosis not present

## 2017-12-20 DIAGNOSIS — M531 Cervicobrachial syndrome: Secondary | ICD-10-CM | POA: Diagnosis not present

## 2017-12-20 DIAGNOSIS — M62838 Other muscle spasm: Secondary | ICD-10-CM | POA: Diagnosis not present

## 2017-12-20 DIAGNOSIS — M50322 Other cervical disc degeneration at C5-C6 level: Secondary | ICD-10-CM | POA: Diagnosis not present

## 2017-12-24 DIAGNOSIS — M531 Cervicobrachial syndrome: Secondary | ICD-10-CM | POA: Diagnosis not present

## 2017-12-24 DIAGNOSIS — M50323 Other cervical disc degeneration at C6-C7 level: Secondary | ICD-10-CM | POA: Diagnosis not present

## 2017-12-24 DIAGNOSIS — M50322 Other cervical disc degeneration at C5-C6 level: Secondary | ICD-10-CM | POA: Diagnosis not present

## 2017-12-24 DIAGNOSIS — M62838 Other muscle spasm: Secondary | ICD-10-CM | POA: Diagnosis not present

## 2017-12-26 DIAGNOSIS — M50323 Other cervical disc degeneration at C6-C7 level: Secondary | ICD-10-CM | POA: Diagnosis not present

## 2017-12-26 DIAGNOSIS — M50322 Other cervical disc degeneration at C5-C6 level: Secondary | ICD-10-CM | POA: Diagnosis not present

## 2017-12-26 DIAGNOSIS — M62838 Other muscle spasm: Secondary | ICD-10-CM | POA: Diagnosis not present

## 2017-12-26 DIAGNOSIS — M531 Cervicobrachial syndrome: Secondary | ICD-10-CM | POA: Diagnosis not present

## 2017-12-27 DIAGNOSIS — M50322 Other cervical disc degeneration at C5-C6 level: Secondary | ICD-10-CM | POA: Diagnosis not present

## 2017-12-27 DIAGNOSIS — F4320 Adjustment disorder, unspecified: Secondary | ICD-10-CM | POA: Diagnosis not present

## 2017-12-27 DIAGNOSIS — M62838 Other muscle spasm: Secondary | ICD-10-CM | POA: Diagnosis not present

## 2017-12-27 DIAGNOSIS — M50323 Other cervical disc degeneration at C6-C7 level: Secondary | ICD-10-CM | POA: Diagnosis not present

## 2017-12-27 DIAGNOSIS — M531 Cervicobrachial syndrome: Secondary | ICD-10-CM | POA: Diagnosis not present

## 2017-12-31 DIAGNOSIS — M531 Cervicobrachial syndrome: Secondary | ICD-10-CM | POA: Diagnosis not present

## 2017-12-31 DIAGNOSIS — M50323 Other cervical disc degeneration at C6-C7 level: Secondary | ICD-10-CM | POA: Diagnosis not present

## 2017-12-31 DIAGNOSIS — M50322 Other cervical disc degeneration at C5-C6 level: Secondary | ICD-10-CM | POA: Diagnosis not present

## 2017-12-31 DIAGNOSIS — M62838 Other muscle spasm: Secondary | ICD-10-CM | POA: Diagnosis not present

## 2018-01-02 DIAGNOSIS — M50323 Other cervical disc degeneration at C6-C7 level: Secondary | ICD-10-CM | POA: Diagnosis not present

## 2018-01-02 DIAGNOSIS — M50322 Other cervical disc degeneration at C5-C6 level: Secondary | ICD-10-CM | POA: Diagnosis not present

## 2018-01-02 DIAGNOSIS — M62838 Other muscle spasm: Secondary | ICD-10-CM | POA: Diagnosis not present

## 2018-01-02 DIAGNOSIS — M531 Cervicobrachial syndrome: Secondary | ICD-10-CM | POA: Diagnosis not present

## 2018-01-03 DIAGNOSIS — M531 Cervicobrachial syndrome: Secondary | ICD-10-CM | POA: Diagnosis not present

## 2018-01-03 DIAGNOSIS — M62838 Other muscle spasm: Secondary | ICD-10-CM | POA: Diagnosis not present

## 2018-01-03 DIAGNOSIS — M50322 Other cervical disc degeneration at C5-C6 level: Secondary | ICD-10-CM | POA: Diagnosis not present

## 2018-01-03 DIAGNOSIS — M50323 Other cervical disc degeneration at C6-C7 level: Secondary | ICD-10-CM | POA: Diagnosis not present

## 2018-01-08 DIAGNOSIS — M62838 Other muscle spasm: Secondary | ICD-10-CM | POA: Diagnosis not present

## 2018-01-08 DIAGNOSIS — M50322 Other cervical disc degeneration at C5-C6 level: Secondary | ICD-10-CM | POA: Diagnosis not present

## 2018-01-08 DIAGNOSIS — M531 Cervicobrachial syndrome: Secondary | ICD-10-CM | POA: Diagnosis not present

## 2018-01-08 DIAGNOSIS — M50323 Other cervical disc degeneration at C6-C7 level: Secondary | ICD-10-CM | POA: Diagnosis not present

## 2018-01-10 ENCOUNTER — Other Ambulatory Visit: Payer: Self-pay | Admitting: Medical

## 2018-01-10 ENCOUNTER — Encounter: Payer: Self-pay | Admitting: Medical

## 2018-01-10 DIAGNOSIS — M50322 Other cervical disc degeneration at C5-C6 level: Secondary | ICD-10-CM | POA: Diagnosis not present

## 2018-01-10 DIAGNOSIS — M50323 Other cervical disc degeneration at C6-C7 level: Secondary | ICD-10-CM | POA: Diagnosis not present

## 2018-01-10 DIAGNOSIS — M531 Cervicobrachial syndrome: Secondary | ICD-10-CM | POA: Diagnosis not present

## 2018-01-10 DIAGNOSIS — M62838 Other muscle spasm: Secondary | ICD-10-CM | POA: Diagnosis not present

## 2018-01-10 MED ORDER — BUSPIRONE HCL 15 MG PO TABS: 15.0000 mg | ORAL_TABLET | Freq: Every day | ORAL | 0 refills | Status: DC | PRN

## 2018-01-10 MED ORDER — SERTRALINE HCL 100 MG PO TABS: 100.0000 mg | ORAL_TABLET | Freq: Every day | ORAL | 0 refills | Status: DC

## 2018-01-15 DIAGNOSIS — M50323 Other cervical disc degeneration at C6-C7 level: Secondary | ICD-10-CM | POA: Diagnosis not present

## 2018-01-15 DIAGNOSIS — M50322 Other cervical disc degeneration at C5-C6 level: Secondary | ICD-10-CM | POA: Diagnosis not present

## 2018-01-15 DIAGNOSIS — M62838 Other muscle spasm: Secondary | ICD-10-CM | POA: Diagnosis not present

## 2018-01-15 DIAGNOSIS — M531 Cervicobrachial syndrome: Secondary | ICD-10-CM | POA: Diagnosis not present

## 2018-01-16 DIAGNOSIS — M531 Cervicobrachial syndrome: Secondary | ICD-10-CM | POA: Diagnosis not present

## 2018-01-16 DIAGNOSIS — M62838 Other muscle spasm: Secondary | ICD-10-CM | POA: Diagnosis not present

## 2018-01-16 DIAGNOSIS — M50323 Other cervical disc degeneration at C6-C7 level: Secondary | ICD-10-CM | POA: Diagnosis not present

## 2018-01-16 DIAGNOSIS — M50322 Other cervical disc degeneration at C5-C6 level: Secondary | ICD-10-CM | POA: Diagnosis not present

## 2018-01-21 DIAGNOSIS — M50323 Other cervical disc degeneration at C6-C7 level: Secondary | ICD-10-CM | POA: Diagnosis not present

## 2018-01-21 DIAGNOSIS — M531 Cervicobrachial syndrome: Secondary | ICD-10-CM | POA: Diagnosis not present

## 2018-01-21 DIAGNOSIS — M50322 Other cervical disc degeneration at C5-C6 level: Secondary | ICD-10-CM | POA: Diagnosis not present

## 2018-01-21 DIAGNOSIS — M62838 Other muscle spasm: Secondary | ICD-10-CM | POA: Diagnosis not present

## 2018-01-23 DIAGNOSIS — M531 Cervicobrachial syndrome: Secondary | ICD-10-CM | POA: Diagnosis not present

## 2018-01-23 DIAGNOSIS — M50323 Other cervical disc degeneration at C6-C7 level: Secondary | ICD-10-CM | POA: Diagnosis not present

## 2018-01-23 DIAGNOSIS — M62838 Other muscle spasm: Secondary | ICD-10-CM | POA: Diagnosis not present

## 2018-01-23 DIAGNOSIS — M50322 Other cervical disc degeneration at C5-C6 level: Secondary | ICD-10-CM | POA: Diagnosis not present

## 2018-01-31 DIAGNOSIS — F4320 Adjustment disorder, unspecified: Secondary | ICD-10-CM | POA: Diagnosis not present

## 2018-02-06 ENCOUNTER — Other Ambulatory Visit: Payer: Self-pay | Admitting: Medical

## 2018-02-06 ENCOUNTER — Encounter: Payer: Self-pay | Admitting: Medical

## 2018-02-06 NOTE — Telephone Encounter (Signed)
Kaitlin DredgeEdward -- please advise refill requests. Pt last seen by you 10/26/17 and has no future appts with you. When is pt due for follow up?

## 2018-02-06 NOTE — Telephone Encounter (Signed)
She was supposed to follow up in August. So please have her follow up within a month/before she runs out of current prescription.

## 2018-02-08 ENCOUNTER — Ambulatory Visit: Payer: BLUE CROSS/BLUE SHIELD | Admitting: Medical

## 2018-02-12 ENCOUNTER — Ambulatory Visit: Payer: BLUE CROSS/BLUE SHIELD | Admitting: Medical

## 2018-02-12 DIAGNOSIS — Z0289 Encounter for other administrative examinations: Secondary | ICD-10-CM

## 2018-02-14 ENCOUNTER — Encounter: Payer: Self-pay | Admitting: Medical

## 2018-02-27 ENCOUNTER — Ambulatory Visit (HOSPITAL_BASED_OUTPATIENT_CLINIC_OR_DEPARTMENT_OTHER)
Admission: RE | Admit: 2018-02-27 | Discharge: 2018-02-27 | Disposition: A | Discharge status: Home / Self Care | Payer: BLUE CROSS/BLUE SHIELD | Source: Ambulatory Visit | Attending: Medical | Admitting: Medical

## 2018-02-27 ENCOUNTER — Encounter: Payer: Self-pay | Admitting: Medical

## 2018-02-27 ENCOUNTER — Telehealth: Payer: Self-pay | Admitting: Medical

## 2018-02-27 ENCOUNTER — Ambulatory Visit (INDEPENDENT_AMBULATORY_CARE_PROVIDER_SITE_OTHER): Payer: BLUE CROSS/BLUE SHIELD | Admitting: Medical

## 2018-02-27 VITALS — BP 115/81 | HR 69 | Temp 98.1°F | Resp 16 | Ht 67.0 in | Wt 260.2 lb

## 2018-02-27 DIAGNOSIS — M542 Cervicalgia: Secondary | ICD-10-CM

## 2018-02-27 DIAGNOSIS — F419 Anxiety disorder, unspecified: Secondary | ICD-10-CM | POA: Diagnosis not present

## 2018-02-27 DIAGNOSIS — M25511 Pain in right shoulder: Secondary | ICD-10-CM | POA: Diagnosis not present

## 2018-02-27 DIAGNOSIS — M5410 Radiculopathy, site unspecified: Secondary | ICD-10-CM

## 2018-02-27 MED ORDER — BUSPIRONE HCL 15 MG PO TABS: 15.0000 mg | ORAL_TABLET | Freq: Every day | ORAL | 5 refills | Status: DC | PRN

## 2018-02-27 MED ORDER — SERTRALINE HCL 100 MG PO TABS: 100.0000 mg | ORAL_TABLET | Freq: Every day | ORAL | 5 refills | Status: DC

## 2018-02-27 NOTE — Telephone Encounter (Signed)
Referral to sports medicine placed

## 2018-02-27 NOTE — Progress Notes (Signed)
Subjective:    Patient ID: Kaitlin Jenkins, female    DOB: February 11, 1971, 47 y.o.   MRN: [id number]  HPI   Pt in for follow up.  Pt state combination of sertraline and buspar helps a lot. She states takes medication daily. Takes meds at night. Sleeps well. Can't take med buspar during the day due to excessive sedation. No depression. Her anxiety is well controlled.  Pt also states rt shoulder and arm intermittent brief numbness(for 3 months). Sometimes will feel like needs all the way down her hand. Pt went to chiropacter for 12 weeks and did not help. No fall or injury. Pt get occasional neck pain but no daily or severe. Chiropracter did xray of her spine.   LMP-2011 hysterectomy.   Review of Systems  Constitutional: Negative for chills, fatigue and fever.  Respiratory: Negative for cough, chest tightness, shortness of breath and wheezing.   Cardiovascular: Negative for chest pain and palpitations.  Gastrointestinal: Negative for abdominal pain.  Musculoskeletal: Positive for neck pain. Negative for back pain.       See hpi.   Skin: Negative for rash.  Neurological: Negative for dizziness, seizures, syncope, weakness and light-headedness.       See hpi. Radicular/neuropathic type pain.  Hematological: Negative for adenopathy. Does not bruise/bleed easily.  Psychiatric/Behavioral: Negative for behavioral problems, dysphoric mood and suicidal ideas. The patient is not nervous/anxious.        Controlled on meds.    Past Medical History:  Diagnosis Date  . Anemia    Mild normocytic anemia  . Anxiety   . Bacterial vaginosis   . Dysmenorrhea   . Endometriosis    history of endometriosis  . Gastroesophageal reflux disease   . H/O tubal ligation   . History of depression   . History of frequent urinary tract infections   . Pelvic pain   . Pyelonephritis    Right pyelonephritis  . Sepsis(995.91)      Social History   Socioeconomic History  . Marital status: Married   Spouse name: Not on file  . Number of children: Not on file  . Years of education: Not on file  . Highest education level: Not on file  Occupational History  . Not on file  Social Needs  . Financial resource strain: Not on file  . Food insecurity:    Worry: Not on file    Inability: Not on file  . Transportation needs:    Medical: Not on file    Non-medical: Not on file  Tobacco Use  . Smoking status: Former Games developer  . Smokeless tobacco: Never Used  Substance and Sexual Activity  . Alcohol use: No  . Drug use: No  . Sexual activity: Not on file  Lifestyle  . Physical activity:    Days per week: Not on file    Minutes per session: Not on file  . Stress: Not on file  Relationships  . Social connections:    Talks on phone: Not on file    Gets together: Not on file    Attends religious service: Not on file    Active member of club or organization: Not on file    Attends meetings of clubs or organizations: Not on file    Relationship status: Not on file  . Intimate partner violence:    Fear of current or ex partner: Not on file    Emotionally abused: Not on file    Physically abused: Not on file  Forced sexual activity: Not on file  Other Topics Concern  . Not on file  Social History Narrative  . Not on file    Past Surgical History:  Procedure Laterality Date  . ABDOMINAL HYSTERECTOMY    . CESAREAN SECTION    . LAPAROSCOPIC CHOLECYSTECTOMY SINGLE SITE WITH INTRAOPERATIVE CHOLANGIOGRAM N/A 04/07/2016   Procedure: LAPAROSCOPIC CHOLECYSTECTOMY SINGLE SITE WITH INTRAOPERATIVE CHOLANGIOGRAM;  Surgeon: Karie Soda, MD;  Location: [facility];  Service: General;  Laterality: N/A;  . OTHER SURGICAL HISTORY     Right breast cyst removal--Right ovarian cyst excision  . VAGINAL HYSTERECTOMY     with left salpingo-oophorectomy    Family History  Problem Relation Age of Onset  . Hypertension Mother   . Mitral valve prolapse Father     Allergies  Allergen Reactions  .  Morphine And Related Nausea And Vomiting  . Ciprofloxacin Swelling  . Sulfa Antibiotics Other (See Comments)    Anticoagulation problems and Bruising   . Sulfa Drugs Cross Reactors Other (See Comments)    Blood will not clot, runs very thin; bruises  . Tape Rash    Current Outpatient Medications on File Prior to Visit  Medication Sig Dispense Refill  . B Complex Vitamins (VITAMIN B COMPLEX PO) Take 1 tablet by mouth.    . busPIRone (BUSPAR) 15 MG tablet TAKE 1 TABLET BY MOUTH DAILY AS NEEDED 30 tablet 0  . CALCIUM PO Take 1 tablet by mouth daily.    . Cholecalciferol (VITAMIN D3) 5000 units CAPS Take 10,000 Units by mouth daily.    . clotrimazole-betamethasone (LOTRISONE) cream Apply 1 application topically 2 (two) times daily. 30 g 0  . ESTRACE VAGINAL 0.1 MG/GM vaginal cream APPLY 2 GRAMS VAGINALLY TWICE A WEEK  8  . hydrocortisone (ANUSOL-HC) 25 MG suppository Place 1 suppository (25 mg total) rectally 2 (two) times daily. 14 suppository 0  . Multiple Vitamins-Minerals (MULTIVITAMIN WITH MINERALS) tablet Take 1 tablet by mouth daily.    . mupirocin ointment (BACTROBAN) 2 % Apply to area twice daily 22 g 2  . OMEGA-3 FATTY ACIDS PO Take 1 capsule by mouth.    Marland Kitchen omeprazole (PRILOSEC) 20 MG capsule Take 1 capsule (20 mg total) by mouth daily. 30 capsule 3  . Probiotic Product (PROBIOTIC PO) Take 1 capsule by mouth daily.    . sertraline (ZOLOFT) 100 MG tablet TAKE 1 TABLET BY MOUTH AT BEDTIME 30 tablet 0   No current facility-administered medications on file prior to visit.     Pulse 69   Temp 98.1 F (36.7 C) (Oral)   Resp 16   Ht 5\' 7"  (1.702 m)   Wt 260 lb 3.2 oz (118 kg)   SpO2 97%   BMI 40.75 kg/m       Objective:   Physical Exam  General Mental Status- Alert. General Appearance- Not in acute distress.   Skin General: Color- Normal Color. Moisture- Normal Moisture.  Neck Carotid Arteries- Normal color. Moisture- Normal Moisture. No carotid bruits. No JVD. Mild  pain rt trap when abducts her arm  Chest and Lung Exam Auscultation: Breath Sounds:-Normal.  Cardiovascular Auscultation:Rythm- Regular. Murmurs & Other Heart Sounds:Auscultation of the heart reveals- No Murmurs.  Abdomen Inspection:-Inspeection Normal. Palpation/Percussion:Note:No mass. Palpation and Percussion of the abdomen reveal- Non Tender, Non Distended + BS, no rebound or guarding.    Neurologic Cranial Nerve exam:- CN III-XII intact(No nystagmus), symmetric smile. Strength:- 5/5 equal and symmetric strength both upper and lower extremities.  Rt upper ext-  hand. Sharp and dull discrimination slight decreased rt hand. Normal strength.   Rt shoulder- mild pain on abduction.       Assessment & Plan:  Your anxiety is well controlled under current regimen of sertraline and BuSpar.  Refilled prescription today.  If while under treatment your anxiety were to worsen please let us know.  For recent neck pain, shoulder pain and some neuropathic/radicular type symptoms, I did put x-ray orders for both cervical spine and her right shoulder.  Your symptoms are mild and intermittent so not prescribing medication presently.  We will follow x-rays and see if reason found for your symptoms.  Considering possible MRI of cervical spine or referral to specialist(sports medicine versus neurosurgeon.)  Follow-up date to be determined after imaging review.  But regarding anxiety would request follow-up in 6 months.  Esperanza RichtersEdward Eulalia Ellerman, PA-C

## 2018-02-27 NOTE — Patient Instructions (Signed)
Your anxiety is well controlled under current regimen of sertraline and BuSpar.  Refilled prescription today.  If while under treatment your anxiety were to worsen please let us know.  For recent neck pain, shoulder pain and some neuropathic/radicular type symptoms, I did put x-ray orders for both cervical spine and her right shoulder.  Your symptoms are mild and intermittent so not prescribing medication presently.  We will follow x-rays and see if reason found for your symptoms.  Considering possible MRI of cervical spine or referral to specialist(sports medicine versus neurosurgeon.)  Follow-up date to be determined after imaging review.  But regarding anxiety would request follow-up in 6 months.

## 2018-03-05 ENCOUNTER — Telehealth: Payer: Self-pay | Admitting: Medical

## 2018-03-05 NOTE — Telephone Encounter (Signed)
Opened to review 

## 2018-03-12 ENCOUNTER — Encounter: Payer: Self-pay | Admitting: Family Medicine

## 2018-03-12 ENCOUNTER — Ambulatory Visit (INDEPENDENT_AMBULATORY_CARE_PROVIDER_SITE_OTHER): Payer: BLUE CROSS/BLUE SHIELD | Admitting: Family Medicine

## 2018-03-12 VITALS — Ht 67.0 in | Wt 260.0 lb

## 2018-03-12 DIAGNOSIS — M5010 Cervical disc disorder with radiculopathy, unspecified cervical region: Secondary | ICD-10-CM | POA: Diagnosis not present

## 2018-03-12 MED ORDER — PREDNISONE 10 MG PO TABS: ORAL_TABLET | ORAL | 0 refills | Status: DC

## 2018-03-12 NOTE — Patient Instructions (Signed)
You have cervical radiculopathy (a pinched nerve in the neck). Prednisone 6 day dose pack to relieve irritation/inflammation of the nerve. Ibuprofen 800mg  three times a day with food for pain and inflammation - start day AFTER finishing prednisone - stop this for now though. Consider cervical collar if severely painful. Simple range of motion exercises within limits of pain to prevent further stiffness. Consider physical therapy for stretching, exercises, traction, and modalities. Heat 15 minutes at a time 3-4 times a day to help with spasms. Watch head position when on computers, texting, when sleeping in bed - should in line with back to prevent further nerve traction and irritation. Consider home traction unit if you get benefit with this in physical therapy. If not improving we will consider an MRI. Call me in a week to let me know how you're doing. If you're doing well, follow up with me in 1 month.

## 2018-03-12 NOTE — Progress Notes (Signed)
   PCP and consultation requested by Esperanza RichtersEdward Saguier PA-C  CC: arm going numb  HPI  R arm numbness - this began about 3 months ago, no injury, no hx of similar on this side (hx of brief spasm of L trap which resolved with icy hot). Initially, she would notice numbness of her R arm into her fingers which was about once per week, it increased to every few days and then daily. She tried chiropractor x 12 weeks with absolutely no change. Her PCP ordered XR and SM referral. She also has a needle sensation over her R forearm and itching afterward. She notes numbness of 3rd and 4th digits of R hand after awakening each morning. No weakness of R hand, able to do all of her ADLs.  No skin changes. Pain is more of a soreness up to 4/10 level.  CC, SH/smoking status, and VS noted  Objective: Ht 5\' 7"  (1.702 m)   Wt 260 lb (117.9 kg)   BMI 40.72 kg/m  Gen: NAD, alert, cooperative, and pleasant. Neck:  No gross deformity, swelling, bruising. TTP mildly right > left paraspinal muscles, trapezius.  No midline/bony TTP. FROM. BUE strength 5/5.   Sensation intact to light touch currently.   2+ equal reflexes in triceps, biceps, brachioradialis tendons. Negative spurlings. NV intact distal BUEs.  Right shoulder: No swelling, ecchymoses.  No gross deformity. No TTP. FROM. Negative Hawkins, Neers. Negative Yergasons. Strength 5/5 with empty can and resisted internal/external rotation. NV intact distally.  Neuro: Alert and oriented, Speech clear, No gross deficits  Assessment and plan:  Arm numbness: consistent with radiculopathy.  Independently reviewed radiographs and noted degenerative changes but these appear higher than symptomatic level.  Her symptoms suggest C7 or 8 radiculopathy.  Start with prednisone dose pack, consider coupling with therapy - declined the latter for now.  Consider MRI if not improving also.  F/u in 1 month if doing well.  Meds ordered this encounter  Medications  .  predniSONE (DELTASONE) 10 MG tablet    Sig: 6 tabs po day 1, 5 tabs po day 2, 4 tabs po day 3, 3 tabs po day 4, 2 tabs po day 5, 1 tab po day 6    Dispense:  21 tablet    Refill:  0    Loni MuseKate Timberlake, MD, PGY3 03/12/2018 3:53 PM

## 2018-03-13 ENCOUNTER — Encounter: Payer: Self-pay | Admitting: Family Medicine

## 2018-03-18 ENCOUNTER — Encounter: Payer: Self-pay | Admitting: Medical

## 2018-03-18 ENCOUNTER — Ambulatory Visit (INDEPENDENT_AMBULATORY_CARE_PROVIDER_SITE_OTHER): Payer: BLUE CROSS/BLUE SHIELD | Admitting: Medical

## 2018-03-18 VITALS — BP 109/76 | HR 111 | Temp 98.0°F | Resp 16 | Ht 67.0 in | Wt 258.4 lb

## 2018-03-18 DIAGNOSIS — R42 Dizziness and giddiness: Secondary | ICD-10-CM | POA: Diagnosis not present

## 2018-03-18 DIAGNOSIS — K219 Gastro-esophageal reflux disease without esophagitis: Secondary | ICD-10-CM

## 2018-03-18 DIAGNOSIS — R109 Unspecified abdominal pain: Secondary | ICD-10-CM | POA: Diagnosis not present

## 2018-03-18 DIAGNOSIS — R197 Diarrhea, unspecified: Secondary | ICD-10-CM | POA: Diagnosis not present

## 2018-03-18 MED ORDER — ONDANSETRON 8 MG PO TBDP: 8.0000 mg | ORAL_TABLET | Freq: Three times a day (TID) | ORAL | 0 refills | Status: DC | PRN

## 2018-03-18 MED ORDER — PANTOPRAZOLE SODIUM 20 MG PO TBEC: 20.0000 mg | DELAYED_RELEASE_TABLET | Freq: Every day | ORAL | 1 refills | Status: DC

## 2018-03-18 NOTE — Progress Notes (Signed)
Subjective:    Patient ID: Kaitlin Jenkins, female    DOB: Jul 29, 1970, 47 y.o.   MRN: [id number]  HPI  Pt in states her heartburn is worse. She states omeprazole. She states after eating will get epigastric pain/burn. She states tums will decrease pain at times. Other times it won't. Pt states omeprazole helps but she associates dizziness with use of omeprazole.  Pt last had stomach pain at 11:30. Right before she went to bed.  No chest pain but then states when she walks will get heartburn at times describes epigastric pain will get worse.. This occurs intermittently. No dyspnea on exertion. Also does not report cardiac associated type signs or symptoms.  In addition see ros acute GI illness symptoms since last night.      Review of Systems  Constitutional: Negative for chills, fatigue and fever.  HENT: Negative for congestion, ear discharge, ear pain, hearing loss and nosebleeds.   Respiratory: Negative for cough and chest tightness.   Cardiovascular: Negative for chest pain and palpitations.  Gastrointestinal: Negative for abdominal pain, diarrhea and vomiting.       Pt also has loose stool and vomiting last night. Husband sick as well. Sister also sick as well. Nieces sick as well with same signs and symptoms.  Musculoskeletal: Negative for back pain.  Skin: Negative for rash.  Neurological: Positive for dizziness. Negative for syncope, facial asymmetry, speech difficulty, weakness and numbness.       Intermittent dizziness on standing.Will last about one day. No associated neurologic signs or symptoms. She last had dizziness 2 weeks ago when had omeprazole.  Hematological: Negative for adenopathy. Does not bruise/bleed easily.  Psychiatric/Behavioral: Negative for behavioral problems, decreased concentration and dysphoric mood.    Past Medical History:  Diagnosis Date  . Anemia    Mild normocytic anemia  . Anxiety   . Bacterial vaginosis   . Dysmenorrhea   . Endometriosis     history of endometriosis  . Gastroesophageal reflux disease   . H/O tubal ligation   . History of depression   . History of frequent urinary tract infections   . Pelvic pain   . Pyelonephritis    Right pyelonephritis  . Sepsis(995.91)      Social History   Socioeconomic History  . Marital status: Married    Spouse name: Not on file  . Number of children: Not on file  . Years of education: Not on file  . Highest education level: Not on file  Occupational History  . Not on file  Social Needs  . Financial resource strain: Not on file  . Food insecurity:    Worry: Not on file    Inability: Not on file  . Transportation needs:    Medical: Not on file    Non-medical: Not on file  Tobacco Use  . Smoking status: Former Research scientist (life sciences)  . Smokeless tobacco: Never Used  Substance and Sexual Activity  . Alcohol use: No  . Drug use: No  . Sexual activity: Not on file  Lifestyle  . Physical activity:    Days per week: Not on file    Minutes per session: Not on file  . Stress: Not on file  Relationships  . Social connections:    Talks on phone: Not on file    Gets together: Not on file    Attends religious service: Not on file    Active member of club or organization: Not on file    Attends meetings of  clubs or organizations: Not on file    Relationship status: Not on file  . Intimate partner violence:    Fear of current or ex partner: Not on file    Emotionally abused: Not on file    Physically abused: Not on file    Forced sexual activity: Not on file  Other Topics Concern  . Not on file  Social History Narrative  . Not on file    Past Surgical History:  Procedure Laterality Date  . ABDOMINAL HYSTERECTOMY    . CESAREAN SECTION    . LAPAROSCOPIC CHOLECYSTECTOMY SINGLE SITE WITH INTRAOPERATIVE CHOLANGIOGRAM N/A 04/07/2016   Procedure: LAPAROSCOPIC CHOLECYSTECTOMY SINGLE SITE WITH INTRAOPERATIVE CHOLANGIOGRAM;  Surgeon: Michael Boston, MD;  Location: [facility];  Service:  General;  Laterality: N/A;  . OTHER SURGICAL HISTORY     Right breast cyst removal--Right ovarian cyst excision  . VAGINAL HYSTERECTOMY     with left salpingo-oophorectomy    Family History  Problem Relation Age of Onset  . Hypertension Mother   . Mitral valve prolapse Father     Allergies  Allergen Reactions  . Morphine And Related Nausea And Vomiting  . Ciprofloxacin Swelling  . Sulfa Antibiotics Other (See Comments)    Anticoagulation problems and Bruising   . Sulfa Drugs Cross Reactors Other (See Comments)    Blood will not clot, runs very thin; bruises  . Tape Rash    Current Outpatient Medications on File Prior to Visit  Medication Sig Dispense Refill  . B Complex Vitamins (VITAMIN B COMPLEX PO) Take 1 tablet by mouth.    . busPIRone (BUSPAR) 15 MG tablet Take 1 tablet (15 mg total) by mouth daily as needed. 30 tablet 5  . CALCIUM PO Take 1 tablet by mouth daily.    . Cholecalciferol (VITAMIN D3) 5000 units CAPS Take 10,000 Units by mouth daily.    . clotrimazole-betamethasone (LOTRISONE) cream Apply 1 application topically 2 (two) times daily. 30 g 0  . ESTRACE VAGINAL 0.1 MG/GM vaginal cream APPLY 2 GRAMS VAGINALLY TWICE A WEEK  8  . hydrocortisone (ANUSOL-HC) 25 MG suppository Place 1 suppository (25 mg total) rectally 2 (two) times daily. 14 suppository 0  . Multiple Vitamins-Minerals (MULTIVITAMIN WITH MINERALS) tablet Take 1 tablet by mouth daily.    . mupirocin ointment (BACTROBAN) 2 % Apply to area twice daily 22 g 2  . OMEGA-3 FATTY ACIDS PO Take 1 capsule by mouth.    Marland Kitchen omeprazole (PRILOSEC) 20 MG capsule Take 1 capsule (20 mg total) by mouth daily. 30 capsule 3  . predniSONE (DELTASONE) 10 MG tablet 6 tabs po day 1, 5 tabs po day 2, 4 tabs po day 3, 3 tabs po day 4, 2 tabs po day 5, 1 tab po day 6 21 tablet 0  . Probiotic Product (PROBIOTIC PO) Take 1 capsule by mouth daily.    . sertraline (ZOLOFT) 100 MG tablet Take 1 tablet (100 mg total) by mouth at  bedtime. 30 tablet 5   No current facility-administered medications on file prior to visit.     BP 109/76   Pulse (!) 111   Temp 98 F (36.7 C) (Oral)   Resp 16   Ht _0  (1.702 m)   Wt 258 lb 6.4 oz (117.2 kg)   SpO2 93%   BMI 40.47 kg/m       Objective:   Physical Exam  General Mental Status- Alert. General Appearance- Not in acute distress.   Skin General: Color-  Normal Color. Moisture- Normal Moisture.  Neck Carotid Arteries- Normal color. Moisture- Normal Moisture. No carotid bruits. No JVD.  Chest and Lung Exam Auscultation: Breath Sounds:-Normal.  Cardiovascular Auscultation:Rythm- Regular. Murmurs & Other Heart Sounds:Auscultation of the heart reveals- No Murmurs.  Abdomen Inspection:-Inspeection Normal. Palpation/Percussion:Note:No mass. Palpation and Percussion of the abdomen reveal- moderate epigastric  Tender, Non Distended + BS, no rebound or guarding.    Neurologic Cranial Nerve exam:- CN III-XII intact(No nystagmus), symmetric smile. Strength:- 5/5 equal and symmetric strength both upper and lower extremities.      Assessment & Plan:  For his GERD type symptoms, I did prescribe pantoprazole since you report potential side effect of dizziness every time you take omeprazole.  Recommend healthy diet and let us know how you respond to pantoprazole.  Regarding your dizziness, the symptoms only occurred when you took omeprazole so definitely recommend stopping that.  If your dizziness again despite stopping omeprazole please let us know.  If you get any dizziness with any neurologic type signs or symptoms and recommend ED evaluation.  For recent diarrhea, this is likely a viral type illness in light of the fact that numerous family members have similar illness.  Make sure that you stay well-hydrated as we want you not to get dizzy associated with potential dehydration.  I did prescribe Zofran for nausea or vomiting.  For mild loose stools you can use  Imodium over-the-counter.  If you have severe loose stools despite these measures then recommend turning in stool panel kit on a Thursday.  You report some occasional abdomen pain/worse reflux  when you walk.  This is intermittent and not always present.  For caution sake did get EKG today to compare to prior EKG(mild tachycardic but no ischemic changes(artifact lead v3.).  On review it looked very similar no acute changes.  You were to get any severe abdomen pain on ambulation, any chest pain or any associated cardiac type signs and symptoms and recommend ED evaluation.  Follow-up in 10 to 14 days or as needed.  Decided to get labs today. To assess current condition in light of short holiday week. Cbc, cmp, lipase and troponin(caution sake) Pt was difficult draw. Per lab appeared dehydrated and pt did not want to continue with repeat attempted draws.  Mackie Pai, PA-C

## 2018-03-18 NOTE — Patient Instructions (Addendum)
For his GERD type symptoms, I did prescribe pantoprazole since you report potential side effect of dizziness every time you take omeprazole.  Recommend healthy diet and let us know how you respond to pantoprazole.  Regarding your dizziness, the symptoms only occurred when you took omeprazole so definitely recommend stopping that.  If your dizziness again despite stopping omeprazole please let us know.  If you get any dizziness with any neurologic type signs or symptoms and recommend ED evaluation.  For recent diarrhea, this is likely a viral type illness in light of the fact that numerous family members have similar illness.  Make sure that you stay well-hydrated as we want you not to get dizzy associated with potential dehydration.  I did prescribe Zofran for nausea or vomiting.  For mild loose stools you can use Imodium over-the-counter.  If you have severe loose stools despite these measures then recommend turning in stool panel kit on a Thursday.  You report some occasional abdomen pain/worse reflux  when you walk.  This is intermittent and not always present.  For caution sake did get EKG today to compare to prior EKG(mild tachycardic but no ischemic changes(artifact lead v3.).  On review it looked very similar no acute changes.  You were to get any severe abdomen pain on ambulation, any chest pain or any associated cardiac type signs and symptoms and recommend ED evaluation.  Follow-up in 10 to 14 days or as needed.

## 2018-05-19 ENCOUNTER — Other Ambulatory Visit: Payer: Self-pay | Admitting: Medical

## 2018-05-21 DIAGNOSIS — J111 Influenza due to unidentified influenza virus with other respiratory manifestations: Secondary | ICD-10-CM | POA: Diagnosis not present

## 2018-05-27 DIAGNOSIS — H40013 Open angle with borderline findings, low risk, bilateral: Secondary | ICD-10-CM | POA: Diagnosis not present

## 2018-07-07 ENCOUNTER — Encounter: Payer: Self-pay | Admitting: Medical

## 2018-07-08 MED ORDER — MUPIROCIN 2 % EX OINT: TOPICAL_OINTMENT | CUTANEOUS | 2 refills | Status: DC

## 2018-07-08 MED ORDER — PANTOPRAZOLE SODIUM 20 MG PO TBEC: DELAYED_RELEASE_TABLET | ORAL | 3 refills | Status: DC

## 2018-07-21 ENCOUNTER — Encounter: Payer: Self-pay | Admitting: Medical

## 2018-07-22 ENCOUNTER — Encounter: Payer: Self-pay | Admitting: Medical

## 2018-07-22 ENCOUNTER — Ambulatory Visit (INDEPENDENT_AMBULATORY_CARE_PROVIDER_SITE_OTHER): Payer: BLUE CROSS/BLUE SHIELD | Admitting: Medical

## 2018-07-22 ENCOUNTER — Other Ambulatory Visit: Payer: Self-pay

## 2018-07-22 DIAGNOSIS — M779 Enthesopathy, unspecified: Secondary | ICD-10-CM | POA: Diagnosis not present

## 2018-07-22 MED ORDER — MELOXICAM 7.5 MG PO TABS: 7.5000 mg | ORAL_TABLET | Freq: Every day | ORAL | 0 refills | Status: DC

## 2018-07-22 NOTE — Patient Instructions (Signed)
Probable rt wrist and forearm tendinitis. Do recommend using wrist cock up splint daily if able to use all day long. Also rx mobic daily. Stop other nsaids. If pain persists then consider xray in one week.   Also if pain persist consider referral to sports medicine.  Follow up as needed. Asked to give my my chart update in one week as to how she is doing.

## 2018-07-22 NOTE — Progress Notes (Signed)
   Subjective:    Patient ID: Kaitlin Jenkins, female    DOB: November 21, 1970, 48 y.o.   MRN: [id number]  HPI  Virtual Visit via Video Note  I am in office and pt is at home.  I connected with Sunshine E Senseney on 07/22/18 at  9:20 AM EDT by a video enabled telemedicine application and verified that I am speaking with the correct person using two identifiers.   I discussed the limitations of evaluation and management by telemedicine and the availability of in person appointments. The patient expressed understanding and agreed to proceed.   History of Present Illness:   Pt has some left wrist pain lateral aspect. She points to ulnar styloid. Pt is rt handed. Pt states pain since Wednesday or Thursday. Looks little puffy. Not red. Mild tender to palpation. Hurts to flex and extend wrist and rotate.   Observations/Objective: Left wrist- no redness on inspection. Faint tender palpation just below wrist over distal ulna. Mild tender ulnar styloid process. Pain on flexion, extension and if rotates wrist. She does type all day working from home.  Pt did not check her blood pressure today. No hx of high bp.  She took some ibuprofen and it helped a little.   Assessment and Plan: Probable rt wrist and forearm tendinitis. Do recommend using wrist cock up splint daily if able to use all day long. Also rx mobic daily. Stop other nsaids. If pain persists then consider xray in one week.   Also if pain persist consider referral to sports medicine.  Follow up as needed. Asked to give my my chart update in one week as to how she is doing.  Follow Up Instructions:    I discussed the assessment and treatment plan with the patient. The patient was provided an opportunity to ask questions and all were answered. The patient agreed with the plan and demonstrated an understanding of the instructions.   The patient was advised to call back or seek an in-person evaluation if the symptoms worsen or if the condition  fails to improve as anticipated.     Esperanza Richters, PA-C   Review of Systems     Objective:   Physical Exam        Assessment & Plan:

## 2018-07-31 ENCOUNTER — Encounter: Payer: Self-pay | Admitting: Medical

## 2018-07-31 ENCOUNTER — Telehealth: Payer: Self-pay | Admitting: Medical

## 2018-07-31 DIAGNOSIS — M25539 Pain in unspecified wrist: Secondary | ICD-10-CM

## 2018-07-31 NOTE — Telephone Encounter (Signed)
Referral to sports medicine placed

## 2018-08-01 ENCOUNTER — Other Ambulatory Visit: Payer: Self-pay

## 2018-08-01 ENCOUNTER — Encounter: Payer: Self-pay | Admitting: Family Medicine

## 2018-08-01 ENCOUNTER — Ambulatory Visit: Payer: Self-pay

## 2018-08-01 ENCOUNTER — Ambulatory Visit (INDEPENDENT_AMBULATORY_CARE_PROVIDER_SITE_OTHER): Payer: BLUE CROSS/BLUE SHIELD | Admitting: Family Medicine

## 2018-08-01 VITALS — BP 107/78 | HR 68 | Ht 67.0 in | Wt 250.0 lb

## 2018-08-01 DIAGNOSIS — M25532 Pain in left wrist: Secondary | ICD-10-CM | POA: Diagnosis not present

## 2018-08-01 DIAGNOSIS — M25531 Pain in right wrist: Secondary | ICD-10-CM

## 2018-08-01 MED ORDER — DICLOFENAC SODIUM 2 % TD SOLN: 1.0000 "application " | Freq: Two times a day (BID) | TRANSDERMAL | 2 refills | Status: DC

## 2018-08-01 NOTE — Assessment & Plan Note (Signed)
Likely her pain is related to the position that she is in while working from home.  May have had some irritation of the TFCC but nothing visualized on ultrasound today.  May have a degenerative component associated with it. -Provided Pennsaid. -Counseled on home exercise therapy and supportive care. -If no improvement would consider imaging or injection to the TFCC.

## 2018-08-01 NOTE — Progress Notes (Signed)
Kaitlin Jenkins - 48 y.o. female MRN [id number]  Date of birth: Aug 12, 1970  SUBJECTIVE:  Including CC & ROS.  Chief Complaint  Patient presents with  . Wrist Pain    left wrist x 1.5 weeks    Kaitlin Jenkins is a 48 y.o. female that is presenting with left ulnar-sided wrist pain.  The pain is been ongoing for about a week and a half.  She denies any inciting event or trauma.  She has been working at home for the past 7 weeks.  The pain is intermittent in nature.  Is localized to this region.  Does have some numbness initially but that has resolved.  She has had some improvement with meloxicam or Aleve.  She has not been taking this on a regular basis.  The pain is sharp.  Has been wearing a brace with limited improvement.  Denies a history of similar pain..   Review of Systems  Constitutional: Negative for fever.  HENT: Negative for congestion.   Respiratory: Negative for cough.   Cardiovascular: Negative for chest pain.  Gastrointestinal: Negative for abdominal pain.  Musculoskeletal: Negative for back pain.  Skin: Negative for color change.  Neurological: Negative for syncope.  Hematological: Negative for adenopathy.    HISTORY: Past Medical, Surgical, Social, and Family History Reviewed & Updated per EMR.   Pertinent Historical Findings include:  Past Medical History:  Diagnosis Date  . Anemia    Mild normocytic anemia  . Anxiety   . Bacterial vaginosis   . Dysmenorrhea   . Endometriosis    history of endometriosis  . Gastroesophageal reflux disease   . H/O tubal ligation   . History of depression   . History of frequent urinary tract infections   . Pelvic pain   . Pyelonephritis    Right pyelonephritis  . Sepsis(995.91)     Past Surgical History:  Procedure Laterality Date  . ABDOMINAL HYSTERECTOMY    . CESAREAN SECTION    . LAPAROSCOPIC CHOLECYSTECTOMY SINGLE SITE WITH INTRAOPERATIVE CHOLANGIOGRAM N/A 04/07/2016   Procedure: LAPAROSCOPIC CHOLECYSTECTOMY SINGLE SITE  WITH INTRAOPERATIVE CHOLANGIOGRAM;  Surgeon: Karie Soda, MD;  Location: [facility];  Service: General;  Laterality: N/A;  . OTHER SURGICAL HISTORY     Right breast cyst removal--Right ovarian cyst excision  . VAGINAL HYSTERECTOMY     with left salpingo-oophorectomy    Allergies  Allergen Reactions  . Morphine And Related Nausea And Vomiting  . Ciprofloxacin Swelling  . Sulfa Antibiotics Other (See Comments)    Anticoagulation problems and Bruising   . Sulfa Drugs Cross Reactors Other (See Comments)    Blood will not clot, runs very thin; bruises  . Tape Rash    Family History  Problem Relation Age of Onset  . Hypertension Mother   . Mitral valve prolapse Father      Social History   Socioeconomic History  . Marital status: Married    Spouse name: Not on file  . Number of children: Not on file  . Years of education: Not on file  . Highest education level: Not on file  Occupational History  . Not on file  Social Needs  . Financial resource strain: Not on file  . Food insecurity:    Worry: Not on file    Inability: Not on file  . Transportation needs:    Medical: Not on file    Non-medical: Not on file  Tobacco Use  . Smoking status: Former Games developer  . Smokeless tobacco: Never  Used  Substance and Sexual Activity  . Alcohol use: No  . Drug use: No  . Sexual activity: Not on file  Lifestyle  . Physical activity:    Days per week: Not on file    Minutes per session: Not on file  . Stress: Not on file  Relationships  . Social connections:    Talks on phone: Not on file    Gets together: Not on file    Attends religious service: Not on file    Active member of club or organization: Not on file    Attends meetings of clubs or organizations: Not on file    Relationship status: Not on file  . Intimate partner violence:    Fear of current or ex partner: Not on file    Emotionally abused: Not on file    Physically abused: Not on file    Forced sexual activity: Not  on file  Other Topics Concern  . Not on file  Social History Narrative  . Not on file     PHYSICAL EXAM:  VS: BP 107/78   Pulse 68   Ht 5\' 7"  (1.702 m)   Wt 250 lb (113.4 kg)   BMI 39.16 kg/m  Physical Exam Gen: NAD, alert, cooperative with exam, well-appearing ENT: normal lips, normal nasal mucosa,  Eye: normal EOM, normal conjunctiva and lids CV:  no edema, +2 pedal pulses   Resp: no accessory muscle use, non-labored,  Skin: no rashes, no areas of induration  Neuro: normal tone, normal sensation to touch Psych:  normal insight, alert and oriented MSK:  Left wrist: No swelling or ecchymosis. Normal range of motion. Some tenderness palpation over the TFCC. No subluxation of the ECU. Normal grip strength. Normal strength resistance with finger abduction and abduction. No clunk. Neurovascular intact  Limited ultrasound: Left wrist:  Normal-appearing ECU with no subluxation upon dynamic testing.  No increased hypervascularity of the ECU.  Normal insertion into the base of the fifth metacarpal. Normal-appearing dorsal wrist compartments. Normal distal ulna  Summary: No specific finding as to the source of her pain.  Ultrasound and interpretation by Clare GandyJeremy Tonnia Bardin, MD      ASSESSMENT & PLAN:   Left wrist pain Likely her pain is related to the position that she is in while working from home.  May have had some irritation of the TFCC but nothing visualized on ultrasound today.  May have a degenerative component associated with it. -Provided Pennsaid. -Counseled on home exercise therapy and supportive care. -If no improvement would consider imaging or injection to the TFCC.

## 2018-08-01 NOTE — Patient Instructions (Signed)
Nice to meet you  Please try the rub on medicine  Please try adjusting your work station. Try to keep your left wrist more straight  Please try ice on the area if needed  Please send me a message on MyChart with any updates or questions  Lets plan on following up in 1-2 months if no better.

## 2018-09-07 ENCOUNTER — Other Ambulatory Visit: Payer: Self-pay | Admitting: Medical

## 2019-03-26 ENCOUNTER — Other Ambulatory Visit: Payer: Self-pay | Admitting: Medical

## 2019-05-05 ENCOUNTER — Other Ambulatory Visit: Payer: Self-pay | Admitting: Medical

## 2019-05-05 DIAGNOSIS — N951 Menopausal and female climacteric states: Secondary | ICD-10-CM | POA: Diagnosis not present

## 2019-05-05 DIAGNOSIS — R5383 Other fatigue: Secondary | ICD-10-CM | POA: Diagnosis not present

## 2019-05-05 DIAGNOSIS — R635 Abnormal weight gain: Secondary | ICD-10-CM | POA: Diagnosis not present

## 2019-05-05 DIAGNOSIS — E8881 Metabolic syndrome: Secondary | ICD-10-CM | POA: Diagnosis not present

## 2019-05-05 DIAGNOSIS — E782 Mixed hyperlipidemia: Secondary | ICD-10-CM | POA: Diagnosis not present

## 2019-05-07 ENCOUNTER — Ambulatory Visit (HOSPITAL_BASED_OUTPATIENT_CLINIC_OR_DEPARTMENT_OTHER)
Admission: RE | Admit: 2019-05-07 | Discharge: 2019-05-07 | Disposition: A | Discharge status: Home / Self Care | Payer: BC Managed Care – PPO | Source: Ambulatory Visit | Attending: Medical | Admitting: Medical

## 2019-05-07 ENCOUNTER — Ambulatory Visit (INDEPENDENT_AMBULATORY_CARE_PROVIDER_SITE_OTHER): Payer: BC Managed Care – PPO | Admitting: Medical

## 2019-05-07 ENCOUNTER — Encounter: Payer: Self-pay | Admitting: Medical

## 2019-05-07 ENCOUNTER — Other Ambulatory Visit: Payer: Self-pay

## 2019-05-07 VITALS — BP 102/62 | HR 79 | Temp 95.5°F | Resp 12 | Ht 67.0 in | Wt 270.0 lb

## 2019-05-07 DIAGNOSIS — Z0000 Encounter for general adult medical examination without abnormal findings: Secondary | ICD-10-CM | POA: Diagnosis not present

## 2019-05-07 DIAGNOSIS — G8929 Other chronic pain: Secondary | ICD-10-CM

## 2019-05-07 DIAGNOSIS — M79671 Pain in right foot: Secondary | ICD-10-CM | POA: Diagnosis not present

## 2019-05-07 DIAGNOSIS — M26629 Arthralgia of temporomandibular joint, unspecified side: Secondary | ICD-10-CM

## 2019-05-07 DIAGNOSIS — F419 Anxiety disorder, unspecified: Secondary | ICD-10-CM

## 2019-05-07 DIAGNOSIS — M7731 Calcaneal spur, right foot: Secondary | ICD-10-CM | POA: Diagnosis not present

## 2019-05-07 MED ORDER — CYCLOBENZAPRINE HCL 5 MG PO TABS: 5.0000 mg | ORAL_TABLET | Freq: Every day | ORAL | 0 refills | Status: DC

## 2019-05-07 NOTE — Patient Instructions (Addendum)
For you wellness exam today did physical today. Please get me all labs done at the weight loss clinic  Declined flu vaccine today.  Recommend exercise and healthy diet.  We will let you know lab results as they come in.  Follow up date appointment will be determined after lab review.   For heel pain will get xray of foot/heel. Will see if you have spur. Even if no spur recommend Dr. Darrick Grinder heel pads, ibuprofen and stretches.  For left tmj area pain recommend eating soft foods for next 7-10 days. Ibuprofen if you take for a week but use lower dose. Also low dose flexeril 5 mg for next 3 nights.  For anxiety continue sertraline and zoloft  Follow up 3 weeks or as needed   Preventive Care 52-47 Years Old, Female Preventive care refers to visits with your health care provider and lifestyle choices that can promote health and wellness. This includes:  A yearly physical exam. This may also be called an annual well check.  Regular dental visits and eye exams.  Immunizations.  Screening for certain conditions.  Healthy lifestyle choices, such as eating a healthy diet, getting regular exercise, not using drugs or products that contain nicotine and tobacco, and limiting alcohol use. What can I expect for my preventive care visit? Physical exam Your health care provider will check your:  Height and weight. This may be used to calculate body mass index (BMI), which tells if you are at a healthy weight.  Heart rate and blood pressure.  Skin for abnormal spots. Counseling Your health care provider may ask you questions about your:  Alcohol, tobacco, and drug use.  Emotional well-being.  Home and relationship well-being.  Sexual activity.  Eating habits.  Work and work Statistician.  Method of birth control.  Menstrual cycle.  Pregnancy history. What immunizations do I need?  Influenza (flu) vaccine  This is recommended every year. Tetanus, diphtheria, and pertussis  (Tdap) vaccine  You may need a Td booster every 10 years. Varicella (chickenpox) vaccine  You may need this if you have not been vaccinated. Zoster (shingles) vaccine  You may need this after age 11. Measles, mumps, and rubella (MMR) vaccine  You may need at least one dose of MMR if you were born in 1957 or later. You may also need a second dose. Pneumococcal conjugate (PCV13) vaccine  You may need this if you have certain conditions and were not previously vaccinated. Pneumococcal polysaccharide (PPSV23) vaccine  You may need one or two doses if you smoke cigarettes or if you have certain conditions. Meningococcal conjugate (MenACWY) vaccine  You may need this if you have certain conditions. Hepatitis A vaccine  You may need this if you have certain conditions or if you travel or work in places where you may be exposed to hepatitis A. Hepatitis B vaccine  You may need this if you have certain conditions or if you travel or work in places where you may be exposed to hepatitis B. Haemophilus influenzae type b (Hib) vaccine  You may need this if you have certain conditions. Human papillomavirus (HPV) vaccine  If recommended by your health care provider, you may need three doses over 6 months. You may receive vaccines as individual doses or as more than one vaccine together in one shot (combination vaccines). Talk with your health care provider about the risks and benefits of combination vaccines. What tests do I need? Blood tests  Lipid and cholesterol levels. These may be checked  every 5 years, or more frequently if you are over 66 years old.  Hepatitis C test.  Hepatitis B test. Screening  Lung cancer screening. You may have this screening every year starting at age 28 if you have a 30-pack-year history of smoking and currently smoke or have quit within the past 15 years.  Colorectal cancer screening. All adults should have this screening starting at age 34 and  continuing until age 14. Your health care provider may recommend screening at age 74 if you are at increased risk. You will have tests every 1-10 years, depending on your results and the type of screening test.  Diabetes screening. This is done by checking your blood sugar (glucose) after you have not eaten for a while (fasting). You may have this done every 1-3 years.  Mammogram. This may be done every 1-2 years. Talk with your health care provider about when you should start having regular mammograms. This may depend on whether you have a family history of breast cancer.  BRCA-related cancer screening. This may be done if you have a family history of breast, ovarian, tubal, or peritoneal cancers.  Pelvic exam and Pap test. This may be done every 3 years starting at age 45. Starting at age 74, this may be done every 5 years if you have a Pap test in combination with an HPV test. Other tests  Sexually transmitted disease (STD) testing.  Bone density scan. This is done to screen for osteoporosis. You may have this scan if you are at high risk for osteoporosis. Follow these instructions at home: Eating and drinking  Eat a diet that includes fresh fruits and vegetables, whole grains, lean protein, and low-fat dairy.  Take vitamin and mineral supplements as recommended by your health care provider.  Do not drink alcohol if: ? Your health care provider tells you not to drink. ? You are pregnant, may be pregnant, or are planning to become pregnant.  If you drink alcohol: ? Limit how much you have to 0-1 drink a day. ? Be aware of how much alcohol is in your drink. In the [place] one drink equals one 12 oz bottle of beer (355 mL), one 5 oz glass of wine (148 mL), or one 1 oz glass of hard liquor (44 mL). Lifestyle  Take daily care of your teeth and gums.  Stay active. Exercise for at least 30 minutes on 5 or more days each week.  Do not use any products that contain nicotine or tobacco,  such as cigarettes, e-cigarettes, and chewing tobacco. If you need help quitting, ask your health care provider.  If you are sexually active, practice safe sex. Use a condom or other form of birth control (contraception) in order to prevent pregnancy and STIs (sexually transmitted infections).  If told by your health care provider, take low-dose aspirin daily starting at age 77. What's next?  Visit your health care provider once a year for a well check visit.  Ask your health care provider how often you should have your eyes and teeth checked.  Stay up to date on all vaccines. This information is not intended to replace advice given to you by your health care provider. Make sure you discuss any questions you have with your health care provider. Document Revised: 11/22/2017 Document Reviewed: 11/22/2017 Elsevier Patient Education  2020 [employer].

## 2019-05-07 NOTE — Progress Notes (Signed)
Subjective:    Patient ID: Kaitlin Jenkins, female    DOB: 1970-07-25, 49 y.o.   MRN: [id number]  HPI  Pt in for wellness exam. Pt state doing well. Pt states already had labs. She states recently. Pt went to weight loss clinic. She had whole panel. Pt did have tsh, lipid panel , cbc and tsh. She will get me those labs.   She also states want physical/cpe. States they cover one every year.   Mammogram done last year- wand was normal.  Pt states colonscopy 3 years ago .some polyps and told to repeat in 5 years.   Pt states a lot pain in her rt heel. Often after seating a long time and then standing. Sometimes first thing in the morning.  Pt states pain will respond to ibuprofen. Pain for a couple of weeks.  Also pain in left tmj area 3-4 months. Pain occurred after gong to dentist and having to open mouth for a long term. No excessive chewing of gum, candies or ice.  Pt anxiety well controlled with bupsar and sertraline.      Review of Systems  Constitutional: Negative for chills, fatigue and fever.  Respiratory: Negative for cough, chest tightness, shortness of breath and wheezing.   Cardiovascular: Negative for chest pain and palpitations.  Gastrointestinal: Negative for abdominal pain, blood in stool, nausea and vomiting.  Musculoskeletal: Negative for back pain, joint swelling and neck stiffness.       Rt heel pain  Skin: Negative for rash.  Neurological: Negative for dizziness and headaches.  Hematological: Negative for adenopathy. Does not bruise/bleed easily.  Psychiatric/Behavioral: Negative for behavioral problems, confusion and sleep disturbance. The patient is not nervous/anxious.     Past Medical History:  Diagnosis Date  . Anemia    Mild normocytic anemia  . Anxiety   . Bacterial vaginosis   . Dysmenorrhea   . Endometriosis    history of endometriosis  . Gastroesophageal reflux disease   . H/O tubal ligation   . History of depression   . History of  frequent urinary tract infections   . Pelvic pain   . Pyelonephritis    Right pyelonephritis  . Sepsis(995.91)      Social History   Socioeconomic History  . Marital status: Married    Spouse name: Not on file  . Number of children: Not on file  . Years of education: Not on file  . Highest education level: Not on file  Occupational History  . Not on file  Tobacco Use  . Smoking status: Former Games developer  . Smokeless tobacco: Never Used  Substance and Sexual Activity  . Alcohol use: No  . Drug use: No  . Sexual activity: Not on file  Other Topics Concern  . Not on file  Social History Narrative  . Not on file   Social Determinants of Health   Financial Resource Strain:   . Difficulty of Paying Living Expenses: Not on file  Food Insecurity:   . Worried About Programme researcher, broadcasting/film/video in the Last Year: Not on file  . Ran Out of Food in the Last Year: Not on file  Transportation Needs:   . Lack of Transportation (Medical): Not on file  . Lack of Transportation (Non-Medical): Not on file  Physical Activity:   . Days of Exercise per Week: Not on file  . Minutes of Exercise per Session: Not on file  Stress:   . Feeling of Stress : Not on file  Social Connections:   . Frequency of Communication with Friends and Family: Not on file  . Frequency of Social Gatherings with Friends and Family: Not on file  . Attends Religious Services: Not on file  . Active Member of Clubs or Organizations: Not on file  . Attends Banker Meetings: Not on file  . Marital Status: Not on file  Intimate Partner Violence:   . Fear of Current or Ex-Partner: Not on file  . Emotionally Abused: Not on file  . Physically Abused: Not on file  . Sexually Abused: Not on file    Past Surgical History:  Procedure Laterality Date  . ABDOMINAL HYSTERECTOMY    . CESAREAN SECTION    . LAPAROSCOPIC CHOLECYSTECTOMY SINGLE SITE WITH INTRAOPERATIVE CHOLANGIOGRAM N/A 04/07/2016   Procedure: LAPAROSCOPIC  CHOLECYSTECTOMY SINGLE SITE WITH INTRAOPERATIVE CHOLANGIOGRAM;  Surgeon: Karie Soda, MD;  Location: [facility];  Service: General;  Laterality: N/A;  . OTHER SURGICAL HISTORY     Right breast cyst removal--Right ovarian cyst excision  . VAGINAL HYSTERECTOMY     with left salpingo-oophorectomy    Family History  Problem Relation Age of Onset  . Hypertension Mother   . Mitral valve prolapse Father     Allergies  Allergen Reactions  . Morphine And Related Nausea And Vomiting  . Ciprofloxacin Swelling  . Sulfa Antibiotics Other (See Comments)    Anticoagulation problems and Bruising   . Sulfa Drugs Cross Reactors Other (See Comments)    Blood will not clot, runs very thin; bruises  . Tape Rash    Current Outpatient Medications on File Prior to Visit  Medication Sig Dispense Refill  . B Complex Vitamins (VITAMIN B COMPLEX PO) Take 1 tablet by mouth.    . busPIRone (BUSPAR) 15 MG tablet TAKE 1 TABLET(15 MG) BY MOUTH TWICE DAILY. PLEASE MAKE APPOINMENT 15 tablet 0  . CALCIUM PO Take 1 tablet by mouth daily.    . Cholecalciferol (VITAMIN D3) 5000 units CAPS Take 10,000 Units by mouth daily.    . Multiple Vitamins-Minerals (MULTIVITAMIN WITH MINERALS) tablet Take 1 tablet by mouth daily.    . mupirocin ointment (BACTROBAN) 2 % Apply to area twice daily 22 g 2  . OMEGA-3 FATTY ACIDS PO Take 1 capsule by mouth.    . pantoprazole (PROTONIX) 20 MG tablet TAKE 1 TABLET(20 MG) BY MOUTH DAILY 90 tablet 3  . Probiotic Product (PROBIOTIC PO) Take 1 capsule by mouth daily.    . sertraline (ZOLOFT) 100 MG tablet TAKE 1 TABLET(100 MG) BY MOUTH AT BEDTIME 15 tablet 0   No current facility-administered medications on file prior to visit.    BP 102/62 (BP Location: Right Arm, Cuff Size: Large)   Pulse 79   Temp (!) 95.5 F (35.3 C) (Temporal)   Resp 12   Ht 5\' 7"  (1.702 m)   Wt 270 lb (122.5 kg)   SpO2 97%   BMI 42.29 kg/m       Objective:   Physical Exam  General Mental Status-  Alert. General Appearance- Not in acute distress.   Skin General: Color- Normal Color. Moisture- Normal Moisture.  Neck Carotid Arteries- Normal color. Moisture- Normal Moisture. No carotid bruits. No JVD.  Chest and Lung Exam Auscultation: Breath Sounds:-Normal.  Cardiovascular Auscultation:Rythm- Regular. Murmurs & Other Heart Sounds:Auscultation of the heart reveals- No Murmurs.  Abdomen Inspection:-Inspeection Normal. Palpation/Percussion:Note:No mass. Palpation and Percussion of the abdomen reveal- Non Tender, Non Distended + BS, no rebound or guarding.   Neurologic  Cranial Nerve exam:- CN III-XII intact(No nystagmus), symmetric smile. Strength:- 5/5 equal and symmetric strength both upper and lower extremities.  Rt foot- direct tenderness to palpation on heel.   Left tmj area- direct pain on palpation.    Assessment & Plan:  For you wellness exam today did physical today. Please get me all labs done at the weight loss clinic  Declined flu vaccine today.  Recommend exercise and healthy diet.  We will let you know lab results as they come in.  Follow up date appointment will be determined after lab review.   For heel pain will get xray of foot/heel. Will see if you have spur. Even if no spur recommend Dr. Darrick Grinder heel pads, ibuprofen and stretches.  For left tmj area pain recommend eating soft foods for next 7-10 days. Ibuprofen if you take for a week but use lower dose.  Follow up 3 weeks or as needed  20 additional minutes spent with pt in addition to cpe.    Mackie Pai, PA-C

## 2019-05-11 ENCOUNTER — Other Ambulatory Visit: Payer: Self-pay | Admitting: Medical

## 2019-05-14 DIAGNOSIS — R232 Flushing: Secondary | ICD-10-CM | POA: Diagnosis not present

## 2019-05-14 DIAGNOSIS — Z72820 Sleep deprivation: Secondary | ICD-10-CM | POA: Diagnosis not present

## 2019-05-14 DIAGNOSIS — R6882 Decreased libido: Secondary | ICD-10-CM | POA: Diagnosis not present

## 2019-05-14 DIAGNOSIS — Z1331 Encounter for screening for depression: Secondary | ICD-10-CM | POA: Diagnosis not present

## 2019-05-14 DIAGNOSIS — N951 Menopausal and female climacteric states: Secondary | ICD-10-CM | POA: Diagnosis not present

## 2019-05-14 DIAGNOSIS — Z1339 Encounter for screening examination for other mental health and behavioral disorders: Secondary | ICD-10-CM | POA: Diagnosis not present

## 2019-05-19 ENCOUNTER — Other Ambulatory Visit: Payer: Self-pay | Admitting: Medical

## 2019-05-26 DIAGNOSIS — N951 Menopausal and female climacteric states: Secondary | ICD-10-CM | POA: Diagnosis not present

## 2019-05-26 DIAGNOSIS — R6882 Decreased libido: Secondary | ICD-10-CM | POA: Diagnosis not present

## 2019-05-26 DIAGNOSIS — Z72820 Sleep deprivation: Secondary | ICD-10-CM | POA: Diagnosis not present

## 2019-05-26 DIAGNOSIS — R232 Flushing: Secondary | ICD-10-CM | POA: Diagnosis not present

## 2019-05-30 ENCOUNTER — Other Ambulatory Visit: Payer: Self-pay | Admitting: Medical

## 2019-06-02 ENCOUNTER — Encounter: Payer: Self-pay | Admitting: Medical

## 2019-06-02 ENCOUNTER — Other Ambulatory Visit: Payer: Self-pay

## 2019-06-02 DIAGNOSIS — Z6841 Body Mass Index (BMI) 40.0 and over, adult: Secondary | ICD-10-CM | POA: Diagnosis not present

## 2019-06-02 MED ORDER — SERTRALINE HCL 100 MG PO TABS: ORAL_TABLET | ORAL | 0 refills | Status: DC

## 2019-06-02 MED ORDER — BUSPIRONE HCL 15 MG PO TABS: ORAL_TABLET | ORAL | 0 refills | Status: DC

## 2019-06-06 ENCOUNTER — Other Ambulatory Visit: Payer: Self-pay | Admitting: Medical

## 2019-06-09 DIAGNOSIS — Z6841 Body Mass Index (BMI) 40.0 and over, adult: Secondary | ICD-10-CM | POA: Diagnosis not present

## 2019-06-09 DIAGNOSIS — R7303 Prediabetes: Secondary | ICD-10-CM | POA: Diagnosis not present

## 2019-06-16 DIAGNOSIS — R7303 Prediabetes: Secondary | ICD-10-CM | POA: Diagnosis not present

## 2019-06-16 DIAGNOSIS — Z6841 Body Mass Index (BMI) 40.0 and over, adult: Secondary | ICD-10-CM | POA: Diagnosis not present

## 2019-06-16 DIAGNOSIS — E782 Mixed hyperlipidemia: Secondary | ICD-10-CM | POA: Diagnosis not present

## 2019-06-21 ENCOUNTER — Other Ambulatory Visit: Payer: Self-pay | Admitting: Medical

## 2019-06-24 ENCOUNTER — Other Ambulatory Visit: Payer: Self-pay | Admitting: Medical

## 2019-06-24 DIAGNOSIS — Z6841 Body Mass Index (BMI) 40.0 and over, adult: Secondary | ICD-10-CM | POA: Diagnosis not present

## 2019-06-24 DIAGNOSIS — R5383 Other fatigue: Secondary | ICD-10-CM | POA: Diagnosis not present

## 2019-06-24 DIAGNOSIS — E782 Mixed hyperlipidemia: Secondary | ICD-10-CM | POA: Diagnosis not present

## 2019-06-24 DIAGNOSIS — R6882 Decreased libido: Secondary | ICD-10-CM | POA: Diagnosis not present

## 2019-06-24 DIAGNOSIS — N951 Menopausal and female climacteric states: Secondary | ICD-10-CM | POA: Diagnosis not present

## 2019-06-24 DIAGNOSIS — R232 Flushing: Secondary | ICD-10-CM | POA: Diagnosis not present

## 2019-06-24 DIAGNOSIS — Z72820 Sleep deprivation: Secondary | ICD-10-CM | POA: Diagnosis not present

## 2019-06-30 DIAGNOSIS — R6882 Decreased libido: Secondary | ICD-10-CM | POA: Diagnosis not present

## 2019-06-30 DIAGNOSIS — R232 Flushing: Secondary | ICD-10-CM | POA: Diagnosis not present

## 2019-06-30 DIAGNOSIS — E8881 Metabolic syndrome: Secondary | ICD-10-CM | POA: Diagnosis not present

## 2019-06-30 DIAGNOSIS — N951 Menopausal and female climacteric states: Secondary | ICD-10-CM | POA: Diagnosis not present

## 2019-07-07 DIAGNOSIS — R7303 Prediabetes: Secondary | ICD-10-CM | POA: Diagnosis not present

## 2019-07-07 DIAGNOSIS — E782 Mixed hyperlipidemia: Secondary | ICD-10-CM | POA: Diagnosis not present

## 2019-07-07 DIAGNOSIS — Z6841 Body Mass Index (BMI) 40.0 and over, adult: Secondary | ICD-10-CM | POA: Diagnosis not present

## 2019-07-10 ENCOUNTER — Other Ambulatory Visit: Payer: Self-pay | Admitting: Medical

## 2019-07-14 DIAGNOSIS — Z6841 Body Mass Index (BMI) 40.0 and over, adult: Secondary | ICD-10-CM | POA: Diagnosis not present

## 2019-07-14 DIAGNOSIS — E782 Mixed hyperlipidemia: Secondary | ICD-10-CM | POA: Diagnosis not present

## 2019-07-14 DIAGNOSIS — R7303 Prediabetes: Secondary | ICD-10-CM | POA: Diagnosis not present

## 2019-07-18 ENCOUNTER — Other Ambulatory Visit: Payer: Self-pay | Admitting: Medical

## 2019-07-20 DIAGNOSIS — M62838 Other muscle spasm: Secondary | ICD-10-CM | POA: Diagnosis not present

## 2019-07-20 DIAGNOSIS — M546 Pain in thoracic spine: Secondary | ICD-10-CM | POA: Diagnosis not present

## 2019-07-28 DIAGNOSIS — R7303 Prediabetes: Secondary | ICD-10-CM | POA: Diagnosis not present

## 2019-07-28 DIAGNOSIS — E8881 Metabolic syndrome: Secondary | ICD-10-CM | POA: Diagnosis not present

## 2019-07-28 DIAGNOSIS — Z6841 Body Mass Index (BMI) 40.0 and over, adult: Secondary | ICD-10-CM | POA: Diagnosis not present

## 2019-08-01 ENCOUNTER — Other Ambulatory Visit: Payer: Self-pay | Admitting: Medical

## 2019-08-05 DIAGNOSIS — M791 Myalgia, unspecified site: Secondary | ICD-10-CM | POA: Diagnosis not present

## 2019-08-05 DIAGNOSIS — R509 Fever, unspecified: Secondary | ICD-10-CM | POA: Diagnosis not present

## 2019-08-05 DIAGNOSIS — Z20822 Contact with and (suspected) exposure to covid-19: Secondary | ICD-10-CM | POA: Diagnosis not present

## 2019-08-05 DIAGNOSIS — R05 Cough: Secondary | ICD-10-CM | POA: Diagnosis not present

## 2019-08-06 ENCOUNTER — Other Ambulatory Visit: Payer: Self-pay

## 2019-08-06 ENCOUNTER — Encounter: Payer: BC Managed Care – PPO | Admitting: Medical

## 2019-08-06 NOTE — Progress Notes (Signed)
   Subjective:    Patient ID: Kaitlin Jenkins, female    DOB: 1970/06/29, 49 y.o.   MRN: [id number]  HPI    Review of Systems     Objective:   Physical Exam        Assessment & Plan:  This encounter was created in error - please disregard.

## 2019-08-07 ENCOUNTER — Encounter: Payer: Self-pay | Admitting: Medical

## 2019-08-07 ENCOUNTER — Telehealth (INDEPENDENT_AMBULATORY_CARE_PROVIDER_SITE_OTHER): Payer: BC Managed Care – PPO | Admitting: Medical

## 2019-08-07 ENCOUNTER — Other Ambulatory Visit: Payer: Self-pay

## 2019-08-07 VITALS — Temp 98.7°F

## 2019-08-07 DIAGNOSIS — R05 Cough: Secondary | ICD-10-CM | POA: Diagnosis not present

## 2019-08-07 DIAGNOSIS — U071 COVID-19: Secondary | ICD-10-CM

## 2019-08-07 DIAGNOSIS — R059 Cough, unspecified: Secondary | ICD-10-CM

## 2019-08-07 MED ORDER — ALBUTEROL SULFATE HFA 108 (90 BASE) MCG/ACT IN AERS: 2.0000 | INHALATION_SPRAY | Freq: Four times a day (QID) | RESPIRATORY_TRACT | 0 refills | Status: DC | PRN

## 2019-08-07 MED ORDER — AZITHROMYCIN 250 MG PO TABS: ORAL_TABLET | ORAL | 0 refills | Status: DC

## 2019-08-07 MED ORDER — BENZONATATE 100 MG PO CAPS: 100.0000 mg | ORAL_CAPSULE | Freq: Three times a day (TID) | ORAL | 0 refills | Status: DC | PRN

## 2019-08-07 NOTE — Patient Instructions (Addendum)
You do have Covid infection.  Overall you appear to be doing well but urgent care you reportedly told you had a 93% oxygen.  Strongly recommended to get the oxygen monitor from pharmacy and check your oxygen saturations daily.  If you have oxygen less than 90% then recommend ED evaluation for chest x-ray and labs.  If oxygen between 90 to 94% on a regular basis please notify me.  Even with low side numbers if you significantly feel short of breath recommend ED evaluation as well.  Minimal intermittent cough presently.  If cough becomes more productive or chest congestion worsens may go ahead and start a azithromycin antibiotic.  Making benzonatate available for cough.  If any shortness of breath or wheezing albuterol inhaler sent to pharmacy as well.  Stay off work for 2 weeks per work guidelines.  Follow-up in 1 week or as needed.

## 2019-08-07 NOTE — Progress Notes (Signed)
   Subjective:    Patient ID: Kaitlin Jenkins, female    DOB: [birth year], 49 y.o.   MRN: [id number]  HPI  Virtual Visit via Video Note  I connected with Anays E Cleavenger on 08/07/19 at 11:00 AM EDT by a video enabled telemedicine application and verified that I am speaking with the correct person using two identifiers.  Location: Patient: home Provider: office  No vitals except for temp.   I discussed the limitations of evaluation and management by telemedicine and the availability of in person appointments. The patient expressed understanding and agreed to proceed.  History of Present Illness:   Sunday state felt sick. Monday she started to feel like had flu. Had ha, fatigue and severe body aches. No loss of smell or taste. Did have fever but none today. Starting to get mild nasal congestion and rare cough(only one time cough was productive). No sob or wheezing.  Pt tested + for covid on Tuesday.  Pt does not have O2 sat monitor.  Feels faint chest congestion.  Pt has 2 weeks off thru her employer.   Observations/Objective:  General-no acute distress, pleasant, oriented. Lungs- on inspection lungs appear unlabored. Neck- no tracheal deviation or jvd on inspection. Neuro- gross motor function appears intact.  Assessment and Plan: You do have Covid infection recently.  Overall you appear to be doing well but urgent care you reportedly told you had a 93% oxygen.  Strongly recommended to get the oxygen monitor from pharmacy and check your oxygen saturations daily.  If you have oxygen less than 90% then recommend ED evaluation for chest x-ray and labs.  If oxygen between 90 to 94% on a regular basis please notify me.  Even with low side numbers if you significantly feel short of breath recommend ED evaluation as well.  Minimal intermittent cough presently.  If cough becomes more productive or chest congestion worsens may go ahead and start a azithromycin antibiotic.  Making  benzonatate available for cough.  If any shortness of breath or wheezing albuterol inhaler sent to pharmacy as well.  Stay off work for 2 weeks per work guidelines.  Follow-up in 1 week or as needed.  Follow Up Instructions:    I discussed the assessment and treatment plan with the patient. The patient was provided an opportunity to ask questions and all were answered. The patient agreed with the plan and demonstrated an understanding of the instructions.   The patient was advised to call back or seek an in-person evaluation if the symptoms worsen or if the condition fails to improve as anticipated.     Esperanza Richters, PA-C    Review of Systems  Constitutional: Positive for fatigue. Negative for chills and fever.  HENT: Positive for congestion.   Respiratory: Positive for cough. Negative for chest tightness, shortness of breath and wheezing.   Cardiovascular: Negative for chest pain and palpitations.  Gastrointestinal: Negative for abdominal pain.  Musculoskeletal: Positive for myalgias.  Neurological: Negative for dizziness and headaches.  Hematological: Negative for adenopathy. Does not bruise/bleed easily.  Psychiatric/Behavioral: Negative for behavioral problems and confusion. The patient is nervous/anxious.        Anxiety is well controlled.       Objective:   Physical Exam        Assessment & Plan:

## 2019-08-19 DIAGNOSIS — F4323 Adjustment disorder with mixed anxiety and depressed mood: Secondary | ICD-10-CM | POA: Diagnosis not present

## 2019-09-08 ENCOUNTER — Other Ambulatory Visit: Payer: Self-pay | Admitting: Medical

## 2019-09-16 DIAGNOSIS — F4323 Adjustment disorder with mixed anxiety and depressed mood: Secondary | ICD-10-CM | POA: Diagnosis not present

## 2019-09-22 DIAGNOSIS — N951 Menopausal and female climacteric states: Secondary | ICD-10-CM | POA: Diagnosis not present

## 2019-09-22 DIAGNOSIS — R232 Flushing: Secondary | ICD-10-CM | POA: Diagnosis not present

## 2019-09-22 DIAGNOSIS — R5383 Other fatigue: Secondary | ICD-10-CM | POA: Diagnosis not present

## 2019-09-22 DIAGNOSIS — R6882 Decreased libido: Secondary | ICD-10-CM | POA: Diagnosis not present

## 2019-09-30 DIAGNOSIS — R6882 Decreased libido: Secondary | ICD-10-CM | POA: Diagnosis not present

## 2019-09-30 DIAGNOSIS — N951 Menopausal and female climacteric states: Secondary | ICD-10-CM | POA: Diagnosis not present

## 2019-10-09 DIAGNOSIS — F4323 Adjustment disorder with mixed anxiety and depressed mood: Secondary | ICD-10-CM | POA: Diagnosis not present

## 2019-10-11 ENCOUNTER — Other Ambulatory Visit: Payer: Self-pay | Admitting: Medical

## 2019-10-14 DIAGNOSIS — R7303 Prediabetes: Secondary | ICD-10-CM | POA: Diagnosis not present

## 2019-10-14 DIAGNOSIS — E8881 Metabolic syndrome: Secondary | ICD-10-CM | POA: Diagnosis not present

## 2019-10-14 DIAGNOSIS — Z6841 Body Mass Index (BMI) 40.0 and over, adult: Secondary | ICD-10-CM | POA: Diagnosis not present

## 2019-10-21 DIAGNOSIS — R7303 Prediabetes: Secondary | ICD-10-CM | POA: Diagnosis not present

## 2019-10-21 DIAGNOSIS — E782 Mixed hyperlipidemia: Secondary | ICD-10-CM | POA: Diagnosis not present

## 2019-10-21 DIAGNOSIS — Z6841 Body Mass Index (BMI) 40.0 and over, adult: Secondary | ICD-10-CM | POA: Diagnosis not present

## 2019-11-03 DIAGNOSIS — F4323 Adjustment disorder with mixed anxiety and depressed mood: Secondary | ICD-10-CM | POA: Diagnosis not present

## 2019-12-29 DIAGNOSIS — R5383 Other fatigue: Secondary | ICD-10-CM | POA: Diagnosis not present

## 2019-12-29 DIAGNOSIS — N958 Other specified menopausal and perimenopausal disorders: Secondary | ICD-10-CM | POA: Diagnosis not present

## 2019-12-31 DIAGNOSIS — R232 Flushing: Secondary | ICD-10-CM | POA: Diagnosis not present

## 2019-12-31 DIAGNOSIS — N958 Other specified menopausal and perimenopausal disorders: Secondary | ICD-10-CM | POA: Diagnosis not present

## 2019-12-31 DIAGNOSIS — Z6841 Body Mass Index (BMI) 40.0 and over, adult: Secondary | ICD-10-CM | POA: Diagnosis not present

## 2020-01-01 ENCOUNTER — Encounter (INDEPENDENT_AMBULATORY_CARE_PROVIDER_SITE_OTHER): Payer: Self-pay

## 2020-02-06 DIAGNOSIS — E785 Hyperlipidemia, unspecified: Secondary | ICD-10-CM | POA: Diagnosis not present

## 2020-02-06 DIAGNOSIS — Z6841 Body Mass Index (BMI) 40.0 and over, adult: Secondary | ICD-10-CM | POA: Diagnosis not present

## 2020-02-06 DIAGNOSIS — R7303 Prediabetes: Secondary | ICD-10-CM | POA: Diagnosis not present

## 2020-02-20 ENCOUNTER — Other Ambulatory Visit: Payer: Self-pay | Admitting: Medical

## 2020-02-25 DIAGNOSIS — L089 Local infection of the skin and subcutaneous tissue, unspecified: Secondary | ICD-10-CM | POA: Diagnosis not present

## 2020-03-08 ENCOUNTER — Encounter (INDEPENDENT_AMBULATORY_CARE_PROVIDER_SITE_OTHER): Payer: Self-pay

## 2020-03-15 DIAGNOSIS — Z713 Dietary counseling and surveillance: Secondary | ICD-10-CM | POA: Diagnosis not present

## 2020-03-16 DIAGNOSIS — F4323 Adjustment disorder with mixed anxiety and depressed mood: Secondary | ICD-10-CM | POA: Diagnosis not present

## 2020-04-21 ENCOUNTER — Other Ambulatory Visit: Payer: Self-pay | Admitting: Medical

## 2020-04-23 DIAGNOSIS — Z20822 Contact with and (suspected) exposure to covid-19: Secondary | ICD-10-CM | POA: Diagnosis not present

## 2020-04-23 DIAGNOSIS — Z03818 Encounter for observation for suspected exposure to other biological agents ruled out: Secondary | ICD-10-CM | POA: Diagnosis not present

## 2020-05-02 ENCOUNTER — Other Ambulatory Visit: Payer: Self-pay | Admitting: Medical

## 2020-05-06 ENCOUNTER — Other Ambulatory Visit: Payer: Self-pay

## 2020-05-07 ENCOUNTER — Encounter: Payer: Self-pay | Admitting: Medical

## 2020-05-07 ENCOUNTER — Ambulatory Visit (INDEPENDENT_AMBULATORY_CARE_PROVIDER_SITE_OTHER): Payer: BC Managed Care – PPO | Admitting: Medical

## 2020-05-07 VITALS — BP 115/75 | HR 81 | Resp 18 | Ht 67.0 in | Wt 277.0 lb

## 2020-05-07 DIAGNOSIS — Z1159 Encounter for screening for other viral diseases: Secondary | ICD-10-CM

## 2020-05-07 DIAGNOSIS — Z0000 Encounter for general adult medical examination without abnormal findings: Secondary | ICD-10-CM | POA: Diagnosis not present

## 2020-05-07 DIAGNOSIS — K219 Gastro-esophageal reflux disease without esophagitis: Secondary | ICD-10-CM | POA: Diagnosis not present

## 2020-05-07 DIAGNOSIS — F419 Anxiety disorder, unspecified: Secondary | ICD-10-CM

## 2020-05-07 DIAGNOSIS — F32A Depression, unspecified: Secondary | ICD-10-CM

## 2020-05-07 DIAGNOSIS — E669 Obesity, unspecified: Secondary | ICD-10-CM

## 2020-05-07 LAB — CBC WITH DIFFERENTIAL/PLATELET
Basophils Absolute: 0.1 10*3/uL (ref 0.0–0.1)
Basophils Relative: 0.8 % (ref 0.0–3.0)
Eosinophils Absolute: 0.2 10*3/uL (ref 0.0–0.7)
Eosinophils Relative: 2.3 % (ref 0.0–5.0)
HCT: 43.9 % (ref 36.0–46.0)
Hemoglobin: 14.6 g/dL (ref 12.0–15.0)
Lymphocytes Relative: 28.3 % (ref 12.0–46.0)
Lymphs Abs: 2.1 10*3/uL (ref 0.7–4.0)
MCHC: 33.4 g/dL (ref 30.0–36.0)
MCV: 86 fl (ref 78.0–100.0)
Monocytes Absolute: 0.6 10*3/uL (ref 0.1–1.0)
Monocytes Relative: 7.8 % (ref 3.0–12.0)
Neutro Abs: 4.5 10*3/uL (ref 1.4–7.7)
Neutrophils Relative %: 60.8 % (ref 43.0–77.0)
Platelets: 293 10*3/uL (ref 150.0–400.0)
RBC: 5.1 Mil/uL (ref 3.87–5.11)
RDW: 14 % (ref 11.5–15.5)
WBC: 7.4 10*3/uL (ref 4.0–10.5)

## 2020-05-07 LAB — LIPID PANEL
Cholesterol: 236 mg/dL — ABNORMAL HIGH (ref 0–200)
HDL: 53.7 mg/dL (ref >39.00)
LDL Cholesterol: 155 mg/dL — ABNORMAL HIGH (ref 0–99)
NonHDL: 182.06
Total CHOL/HDL Ratio: 4
Triglycerides: 134 mg/dL (ref 0.0–149.0)
VLDL: 26.8 mg/dL (ref 0.0–40.0)

## 2020-05-07 LAB — COMPREHENSIVE METABOLIC PANEL
ALT: 27 U/L (ref 0–35)
AST: 16 U/L (ref 0–37)
Albumin: 4.5 g/dL (ref 3.5–5.2)
Alkaline Phosphatase: 94 U/L (ref 39–117)
BUN: 16 mg/dL (ref 6–23)
CO2: 31 mEq/L (ref 19–32)
Calcium: 9.7 mg/dL (ref 8.4–10.5)
Chloride: 103 mEq/L (ref 96–112)
Creatinine, Ser: 0.89 mg/dL (ref 0.40–1.20)
GFR: 76.78 mL/min (ref >60.00)
Glucose, Bld: 109 mg/dL — ABNORMAL HIGH (ref 70–99)
Potassium: 4.2 mEq/L (ref 3.5–5.1)
Sodium: 142 mEq/L (ref 135–145)
Total Bilirubin: 0.5 mg/dL (ref 0.2–1.2)
Total Protein: 7.8 g/dL (ref 6.0–8.3)

## 2020-05-07 MED ORDER — SERTRALINE HCL 100 MG PO TABS: 100.0000 mg | ORAL_TABLET | Freq: Every day | ORAL | 1 refills | Status: DC

## 2020-05-07 MED ORDER — PANTOPRAZOLE SODIUM 20 MG PO TBEC: DELAYED_RELEASE_TABLET | ORAL | 3 refills | Status: DC

## 2020-05-07 NOTE — Progress Notes (Signed)
Subjective:    Patient ID: Kaitlin Jenkins, female    DOB: Jun 10, 1970, 50 y.o.   MRN: [id number]  HPI  Pt in for cpe/wellness.  Pt updates me that she will get gastric sleeve surgery may be April or May.  Pt works for [employer] , Pt just got [employer]. Pt diet improved. Non smoker. Stopped 4 years ago. Alcohol very rare.  Pt has hx of depression and anxiety. Controlled with sertraline and buspar.(phq-9 score 2)   Pt has hx of gerd. Controlled with protonix. If stops med will return so she continues.   Obesity-  Pt updates me that she will get gastric sleeve surgery may be April or May.  Hx of polyp in the past. Told to follow up in 5 years. Done by [facility].  She is not sure when due.   Review of Systems  Constitutional: Negative for activity change, chills, diaphoresis, fatigue and fever.  HENT: Negative for congestion and drooling.   Respiratory: Negative for cough, chest tightness, shortness of breath and wheezing.   Cardiovascular: Negative for chest pain, palpitations and leg swelling.  Gastrointestinal: Negative for abdominal pain, constipation, diarrhea, nausea and vomiting.  Genitourinary: Negative for dysuria.  Musculoskeletal: Negative for back pain, neck pain and neck stiffness.  Neurological: Negative for dizziness, syncope, weakness, light-headedness and headaches.  Psychiatric/Behavioral: Negative for agitation, behavioral problems, confusion and dysphoric mood. The patient is not nervous/anxious.        Mood is well controlled.    Past Medical History:  Diagnosis Date  . Anemia    Mild normocytic anemia  . Anxiety   . Bacterial vaginosis   . Dysmenorrhea   . Endometriosis    history of endometriosis  . Gastroesophageal reflux disease   . H/O tubal ligation   . History of depression   . History of frequent urinary tract infections   . Pelvic pain   . Pyelonephritis    Right pyelonephritis  . Sepsis(995.91)      Social History   Socioeconomic  History  . Marital status: Married    Spouse name: Not on file  . Number of children: Not on file  . Years of education: Not on file  . Highest education level: Not on file  Occupational History  . Not on file  Tobacco Use  . Smoking status: Former Games developer  . Smokeless tobacco: Never Used  Substance and Sexual Activity  . Alcohol use: No  . Drug use: No  . Sexual activity: Not on file  Other Topics Concern  . Not on file  Social History Narrative  . Not on file   Social Determinants of Health   Financial Resource Strain: Not on file  Food Insecurity: Not on file  Transportation Needs: Not on file  Physical Activity: Not on file  Stress: Not on file  Social Connections: Not on file  Intimate Partner Violence: Not on file    Past Surgical History:  Procedure Laterality Date  . ABDOMINAL HYSTERECTOMY    . CESAREAN SECTION    . LAPAROSCOPIC CHOLECYSTECTOMY SINGLE SITE WITH INTRAOPERATIVE CHOLANGIOGRAM N/A 04/07/2016   Procedure: LAPAROSCOPIC CHOLECYSTECTOMY SINGLE SITE WITH INTRAOPERATIVE CHOLANGIOGRAM;  Surgeon: Karie Soda, MD;  Location: [facility];  Service: General;  Laterality: N/A;  . OTHER SURGICAL HISTORY     Right breast cyst removal--Right ovarian cyst excision  . VAGINAL HYSTERECTOMY     with left salpingo-oophorectomy    Family History  Problem Relation Age of Onset  . Hypertension  Mother   . Mitral valve prolapse Father     Allergies  Allergen Reactions  . Morphine And Related Nausea And Vomiting  . Ciprofloxacin Swelling  . Sulfa Antibiotics Other (See Comments)    Anticoagulation problems and Bruising   . Sulfa Drugs Cross Reactors Other (See Comments)    Blood will not clot, runs very thin; bruises  . Tape Rash    Current Outpatient Medications on File Prior to Visit  Medication Sig Dispense Refill  . B Complex Vitamins (VITAMIN B COMPLEX PO) Take 1 tablet by mouth.    . busPIRone (BUSPAR) 15 MG tablet TAKE 1 TABLET BY MOUTH TWICE DAILY 180  tablet 1  . CALCIUM PO Take 1 tablet by mouth daily.    . Cholecalciferol (VITAMIN D3) 5000 units CAPS Take 10,000 Units by mouth daily.    . Multiple Vitamins-Minerals (MULTIVITAMIN WITH MINERALS) tablet Take 1 tablet by mouth daily.    . mupirocin ointment (BACTROBAN) 2 % Apply to area twice daily 22 g 2  . OMEGA-3 FATTY ACIDS PO Take 1 capsule by mouth.    . pantoprazole (PROTONIX) 20 MG tablet TAKE 1 TABLET(20 MG) BY MOUTH DAILY 90 tablet 3  . Probiotic Product (PROBIOTIC PO) Take 1 capsule by mouth daily.    . sertraline (ZOLOFT) 100 MG tablet Take 1 tablet (100 mg total) by mouth at bedtime. 30 tablet 1  . albuterol (VENTOLIN HFA) 108 (90 Base) MCG/ACT inhaler Inhale 2 puffs into the lungs every 6 (six) hours as needed. 18 g 0  . azithromycin (ZITHROMAX) 250 MG tablet Take 2 tablets by mouth on day 1, followed by 1 tablet by mouth daily for 4 days. 6 tablet 0  . benzonatate (TESSALON) 100 MG capsule Take 1 capsule (100 mg total) by mouth 3 (three) times daily as needed for cough. 30 capsule 0  . cyclobenzaprine (FLEXERIL) 5 MG tablet Take 1 tablet (5 mg total) by mouth at bedtime. 3 tablet 0   No current facility-administered medications on file prior to visit.    BP 116/90   Pulse 81   Resp 18   Ht 5\' 7"  (1.702 m)   Wt 277 lb (125.6 kg)   SpO2 94%   BMI 43.38 kg/m       Objective:   Physical Exam  General Mental Status- Alert. General Appearance- Not in acute distress.   Skin General: Color- Normal Color. Moisture- Normal Moisture.  Neck Carotid Arteries- Normal color. Moisture- Normal Moisture. No carotid bruits. No JVD.  Chest and Lung Exam Auscultation: Breath Sounds:-Normal.  Cardiovascular Auscultation:Rythm- Regular. Murmurs & Other Heart Sounds:Auscultation of the heart reveals- No Murmurs.  Abdomen Inspection:-Inspeection Normal. Palpation/Percussion:Note:No mass. Palpation and Percussion of the abdomen reveal- Non Tender, Non Distended + BS, no  rebound or guarding.   Neurologic Cranial Nerve exam:- CN III-XII intact(No nystagmus), symmetric smile. Strength:- 5/5 equal and symmetric strength both upper and lower extremities.  Derm- no worrisome lesions, moles only mild acne lower back.    Assessment & Plan:  For you wellness exam today I have ordered cbc, cmp, hep c screening  and  lipid panel  Tdap declined.   Recommend exercise and healthy diet.  We will let you know lab results as they come in.  Follow up date appointment will be determined after lab review.   Your anxiety and depression controlled with sertraline and buspar.  Gerd controlled with protonix and diet.  For obesity continue diet, exercise and planning on gastric sleeve.  Please schedule your pap with gyn.  Es[place]anza [place], [place]   [id number] charge as well. Addressed anxiety, depression, gerd and discussed obesity(planned gastric sleeve)

## 2020-05-07 NOTE — Patient Instructions (Signed)
For you wellness exam today I have ordered cbc, cmp, hep c screening  and  lipid panel  Tdap declined.   Recommend exercise and healthy diet.  We will let you know lab results as they come in.  Follow up date appointment will be determined after lab review.   Your anxiety and depression controlled with sertraline and buspar.  Gerd controlled with protonix and diet.  For obesity continue diet, exercise and planning on gastric sleeve.  Please schedule your pap with gyn.   Preventive Care 44-72 Years Old, Female Preventive care refers to lifestyle choices and visits with your health care provider that can promote health and wellness. This includes:  A yearly physical exam. This is also called an annual wellness visit.  Regular dental and eye exams.  Immunizations.  Screening for certain conditions.  Healthy lifestyle choices, such as: ? Eating a healthy diet. ? Getting regular exercise. ? Not using drugs or products that contain nicotine and tobacco. ? Limiting alcohol use. What can I expect for my preventive care visit? Physical exam Your health care provider will check your:  Height and weight. These may be used to calculate your BMI (body mass index). BMI is a measurement that tells if you are at a healthy weight.  Heart rate and blood pressure.  Body temperature.  Skin for abnormal spots. Counseling Your health care provider may ask you questions about your:  Past medical problems.  Family's medical history.  Alcohol, tobacco, and drug use.  Emotional well-being.  Home life and relationship well-being.  Sexual activity.  Diet, exercise, and sleep habits.  Work and work Statistician.  Access to firearms.  Method of birth control.  Menstrual cycle.  Pregnancy history. What immunizations do I need? Vaccines are usually given at various ages, according to a schedule. Your health care provider will recommend vaccines for you based on your age,  medical history, and lifestyle or other factors, such as travel or where you work.   What tests do I need? Blood tests  Lipid and cholesterol levels. These may be checked every 5 years, or more often if you are over 36 years old.  Hepatitis C test.  Hepatitis B test. Screening  Lung cancer screening. You may have this screening every year starting at age 27 if you have a 30-pack-year history of smoking and currently smoke or have quit within the past 15 years.  Colorectal cancer screening. ? All adults should have this screening starting at age 62 and continuing until age 21. ? Your health care provider may recommend screening at age 79 if you are at increased risk. ? You will have tests every 1-10 years, depending on your results and the type of screening test.  Diabetes screening. ? This is done by checking your blood sugar (glucose) after you have not eaten for a while (fasting). ? You may have this done every 1-3 years.  Mammogram. ? This may be done every 1-2 years. ? Talk with your health care provider about when you should start having regular mammograms. This may depend on whether you have a family history of breast cancer.  BRCA-related cancer screening. This may be done if you have a family history of breast, ovarian, tubal, or peritoneal cancers.  Pelvic exam and Pap test. ? This may be done every 3 years starting at age 1. ? Starting at age 55, this may be done every 5 years if you have a Pap test in combination with an HPV test.  Other tests  STD (sexually transmitted disease) testing, if you are at risk.  Bone density scan. This is done to screen for osteoporosis. You may have this scan if you are at high risk for osteoporosis. Talk with your health care provider about your test results, treatment options, and if necessary, the need for more tests. Follow these instructions at home: Eating and drinking  Eat a diet that includes fresh fruits and vegetables, whole  grains, lean protein, and low-fat dairy products.  Take vitamin and mineral supplements as recommended by your health care provider.  Do not drink alcohol if: ? Your health care provider tells you not to drink. ? You are pregnant, may be pregnant, or are planning to become pregnant.  If you drink alcohol: ? Limit how much you have to 0-1 drink a day. ? Be aware of how much alcohol is in your drink. In the [place] one drink equals one 12 oz bottle of beer (355 mL), one 5 oz glass of wine (148 mL), or one 1 oz glass of hard liquor (44 mL).   Lifestyle  Take daily care of your teeth and gums. Brush your teeth every morning and night with fluoride toothpaste. Floss one time each day.  Stay active. Exercise for at least 30 minutes 5 or more days each week.  Do not use any products that contain nicotine or tobacco, such as cigarettes, e-cigarettes, and chewing tobacco. If you need help quitting, ask your health care provider.  Do not use drugs.  If you are sexually active, practice safe sex. Use a condom or other form of protection to prevent STIs (sexually transmitted infections).  If you do not wish to become pregnant, use a form of birth control. If you plan to become pregnant, see your health care provider for a prepregnancy visit.  If told by your health care provider, take low-dose aspirin daily starting at age 45.  Find healthy ways to cope with stress, such as: ? Meditation, yoga, or listening to music. ? Journaling. ? Talking to a trusted person. ? Spending time with friends and family. Safety  Always wear your seat belt while driving or riding in a vehicle.  Do not drive: ? If you have been drinking alcohol. Do not ride with someone who has been drinking. ? When you are tired or distracted. ? While texting.  Wear a helmet and other protective equipment during sports activities.  If you have firearms in your house, make sure you follow all gun safety procedures. What's  next?  Visit your health care provider once a year for an annual wellness visit.  Ask your health care provider how often you should have your eyes and teeth checked.  Stay up to date on all vaccines. This information is not intended to replace advice given to you by your health care provider. Make sure you discuss any questions you have with your health care provider. Document Revised: 12/16/2019 Document Reviewed: 11/22/2017 Elsevier Patient Education  2021 [employer].

## 2020-05-10 ENCOUNTER — Encounter: Payer: Self-pay | Admitting: Medical

## 2020-05-10 LAB — HEPATITIS C ANTIBODY
Hepatitis C Ab: NONREACTIVE
SIGNAL TO CUT-OFF: 0.01 (ref <1.00)

## 2020-06-16 ENCOUNTER — Other Ambulatory Visit: Payer: Self-pay | Admitting: Medical

## 2020-06-21 ENCOUNTER — Encounter: Payer: Self-pay | Admitting: Medical

## 2020-06-21 DIAGNOSIS — B029 Zoster without complications: Secondary | ICD-10-CM | POA: Diagnosis not present

## 2020-06-25 ENCOUNTER — Ambulatory Visit: Payer: BC Managed Care – PPO | Admitting: Medical

## 2020-06-29 ENCOUNTER — Other Ambulatory Visit: Payer: Self-pay | Admitting: Medical

## 2020-06-29 NOTE — Telephone Encounter (Signed)
Letter sent to Pt to inform she is due for f/u in June 2022. Instructed her to call office to schedule visit.

## 2020-06-29 NOTE — Telephone Encounter (Signed)
Refilled pt sertraline. Will you get pt scheduled for follow up in June.

## 2020-07-30 ENCOUNTER — Other Ambulatory Visit: Payer: Self-pay | Admitting: Medical

## 2020-08-31 DIAGNOSIS — L0291 Cutaneous abscess, unspecified: Secondary | ICD-10-CM | POA: Diagnosis not present

## 2020-08-31 DIAGNOSIS — L089 Local infection of the skin and subcutaneous tissue, unspecified: Secondary | ICD-10-CM | POA: Diagnosis not present

## 2021-02-17 DIAGNOSIS — Z9049 Acquired absence of other specified parts of digestive tract: Secondary | ICD-10-CM | POA: Diagnosis not present

## 2021-02-17 DIAGNOSIS — Z90710 Acquired absence of both cervix and uterus: Secondary | ICD-10-CM | POA: Diagnosis not present

## 2021-02-17 DIAGNOSIS — K5790 Diverticulosis of intestine, part unspecified, without perforation or abscess without bleeding: Secondary | ICD-10-CM | POA: Diagnosis not present

## 2021-02-17 DIAGNOSIS — K429 Umbilical hernia without obstruction or gangrene: Secondary | ICD-10-CM | POA: Diagnosis not present

## 2021-02-17 DIAGNOSIS — R112 Nausea with vomiting, unspecified: Secondary | ICD-10-CM | POA: Diagnosis not present

## 2021-02-17 DIAGNOSIS — R1084 Generalized abdominal pain: Secondary | ICD-10-CM | POA: Diagnosis not present

## 2021-02-17 DIAGNOSIS — R0789 Other chest pain: Secondary | ICD-10-CM | POA: Diagnosis not present

## 2021-02-17 DIAGNOSIS — R079 Chest pain, unspecified: Secondary | ICD-10-CM | POA: Diagnosis not present

## 2021-02-18 DIAGNOSIS — K429 Umbilical hernia without obstruction or gangrene: Secondary | ICD-10-CM | POA: Diagnosis not present

## 2021-02-18 DIAGNOSIS — Z9049 Acquired absence of other specified parts of digestive tract: Secondary | ICD-10-CM | POA: Diagnosis not present

## 2021-02-18 DIAGNOSIS — Z90710 Acquired absence of both cervix and uterus: Secondary | ICD-10-CM | POA: Diagnosis not present

## 2021-02-18 DIAGNOSIS — K5790 Diverticulosis of intestine, part unspecified, without perforation or abscess without bleeding: Secondary | ICD-10-CM | POA: Diagnosis not present

## 2021-02-19 DIAGNOSIS — I498 Other specified cardiac arrhythmias: Secondary | ICD-10-CM | POA: Diagnosis not present

## 2021-04-18 ENCOUNTER — Other Ambulatory Visit: Payer: Self-pay | Admitting: Medical

## 2021-05-02 DIAGNOSIS — Z1231 Encounter for screening mammogram for malignant neoplasm of breast: Secondary | ICD-10-CM | POA: Diagnosis not present

## 2021-05-15 ENCOUNTER — Other Ambulatory Visit: Payer: Self-pay | Admitting: Medical

## 2021-06-13 ENCOUNTER — Other Ambulatory Visit: Payer: Self-pay | Admitting: Medical

## 2021-08-01 ENCOUNTER — Other Ambulatory Visit: Payer: Self-pay | Admitting: Medical

## 2021-08-31 ENCOUNTER — Other Ambulatory Visit: Payer: Self-pay | Admitting: Medical

## 2021-09-30 ENCOUNTER — Other Ambulatory Visit: Payer: Self-pay | Admitting: Medical

## 2021-10-17 DIAGNOSIS — Z90710 Acquired absence of both cervix and uterus: Secondary | ICD-10-CM | POA: Diagnosis not present

## 2021-10-17 DIAGNOSIS — R102 Pelvic and perineal pain: Secondary | ICD-10-CM | POA: Diagnosis not present

## 2021-10-17 DIAGNOSIS — N941 Unspecified dyspareunia: Secondary | ICD-10-CM | POA: Diagnosis not present

## 2021-10-17 DIAGNOSIS — Z01411 Encounter for gynecological examination (general) (routine) with abnormal findings: Secondary | ICD-10-CM | POA: Diagnosis not present

## 2021-10-17 DIAGNOSIS — L900 Lichen sclerosus et atrophicus: Secondary | ICD-10-CM | POA: Diagnosis not present

## 2021-10-19 DIAGNOSIS — H43811 Vitreous degeneration, right eye: Secondary | ICD-10-CM | POA: Diagnosis not present

## 2021-10-30 ENCOUNTER — Other Ambulatory Visit: Payer: Self-pay | Admitting: Medical

## 2021-10-31 ENCOUNTER — Other Ambulatory Visit: Payer: Self-pay | Admitting: Medical

## 2021-10-31 ENCOUNTER — Encounter: Payer: Self-pay | Admitting: Medical

## 2021-11-02 DIAGNOSIS — K648 Other hemorrhoids: Secondary | ICD-10-CM | POA: Diagnosis not present

## 2021-11-02 DIAGNOSIS — D125 Benign neoplasm of sigmoid colon: Secondary | ICD-10-CM | POA: Diagnosis not present

## 2021-11-02 DIAGNOSIS — Z8371 Family history of colonic polyps: Secondary | ICD-10-CM | POA: Diagnosis not present

## 2021-11-02 DIAGNOSIS — Z8719 Personal history of other diseases of the digestive system: Secondary | ICD-10-CM | POA: Diagnosis not present

## 2021-11-02 DIAGNOSIS — Z1211 Encounter for screening for malignant neoplasm of colon: Secondary | ICD-10-CM | POA: Diagnosis not present

## 2021-11-02 DIAGNOSIS — K635 Polyp of colon: Secondary | ICD-10-CM | POA: Diagnosis not present

## 2021-11-09 ENCOUNTER — Ambulatory Visit (INDEPENDENT_AMBULATORY_CARE_PROVIDER_SITE_OTHER): Payer: BC Managed Care – PPO | Admitting: Medical

## 2021-11-09 ENCOUNTER — Encounter: Payer: Self-pay | Admitting: Medical

## 2021-11-09 VITALS — BP 100/70 | HR 71 | Resp 18 | Ht 67.0 in | Wt 248.6 lb

## 2021-11-09 DIAGNOSIS — F419 Anxiety disorder, unspecified: Secondary | ICD-10-CM

## 2021-11-09 DIAGNOSIS — M25531 Pain in right wrist: Secondary | ICD-10-CM

## 2021-11-09 DIAGNOSIS — E669 Obesity, unspecified: Secondary | ICD-10-CM

## 2021-11-09 DIAGNOSIS — K219 Gastro-esophageal reflux disease without esophagitis: Secondary | ICD-10-CM

## 2021-11-09 DIAGNOSIS — F32A Depression, unspecified: Secondary | ICD-10-CM

## 2021-11-09 MED ORDER — SERTRALINE HCL 100 MG PO TABS: ORAL_TABLET | ORAL | 11 refills | Status: DC

## 2021-11-09 NOTE — Patient Instructions (Addendum)
Anxiety and depression well controlled with sertraline and buspar. Continue both.  Kaitlin Jenkins presently resolved with diet alone. Glad to hear no longer needing protonix.  Great job with recent weight loss. Continue with current program.   For wrist pain dominant hand refer to sports medicine. During interim can use wrist cock up splint and low dose ibuprofen.    Follow up in 6 months or sooner if needed.

## 2021-11-09 NOTE — Addendum Note (Signed)
Addended by: Gwenevere Abbot on: 11/09/2021 08:29 AM   Modules accepted: Orders

## 2021-11-09 NOTE — Progress Notes (Signed)
Subjective:    Patient ID: Kaitlin Jenkins, female    DOB: 11-17-1970, 51 y.o.   MRN: [id number]  HPI Depression and anxiety. Controlled with sertraline and buspar.Genella Rife. Controlled with protonix. If stops med will return so she continues. But she states with improved diet with weight loss program no further gerd.  Obesity-  Pt on last visit  updated me that she will get gastric sleeve surgery may be April or May. However she jointed [place] weight loss. With this she lost 42 lbs.   Pt stats thru diet her labs are much better. Lower sugar and lower cholesterol.   Pt has some recent mild wrist pain. Pain on unlnar deviation movement. Pain for 2 weeks. Not getting. Pt is deviated.   Review of Systems  Constitutional:  Negative for activity change, chills, diaphoresis, fatigue and fever.  HENT:  Negative for congestion and drooling.   Respiratory:  Negative for cough, chest tightness, shortness of breath and wheezing.   Cardiovascular:  Negative for chest pain, palpitations and leg swelling.  Gastrointestinal:  Negative for abdominal pain, constipation, diarrhea, nausea and vomiting.  Genitourinary:  Negative for dysuria.  Musculoskeletal:  Negative for back pain, neck pain and neck stiffness.  Neurological:  Negative for dizziness, syncope, weakness, light-headedness and headaches.  Psychiatric/Behavioral:  Negative for agitation, behavioral problems, confusion and dysphoric mood. The patient is not nervous/anxious.        Mood is well controlled.   Past Medical History:  Diagnosis Date   Anemia    Mild normocytic anemia   Anxiety    Bacterial vaginosis    Dysmenorrhea    Endometriosis    history of endometriosis   Gastroesophageal reflux disease    H/O tubal ligation    History of depression    History of frequent urinary tract infections    Pelvic pain    Pyelonephritis    Right pyelonephritis   Sepsis(995.91)      Social History   Socioeconomic History    Marital status: Married    Spouse name: Not on file   Number of children: Not on file   Years of education: Not on file   Highest education level: Not on file  Occupational History   Not on file  Tobacco Use   Smoking status: Former   Smokeless tobacco: Never  Substance and Sexual Activity   Alcohol use: No   Drug use: No   Sexual activity: Not on file  Other Topics Concern   Not on file  Social History Narrative   Not on file   Social Determinants of Health   Financial Resource Strain: Not on file  Food Insecurity: Not on file  Transportation Needs: Not on file  Physical Activity: Not on file  Stress: Not on file  Social Connections: Not on file  Intimate Partner Violence: Not on file    Past Surgical History:  Procedure Laterality Date   ABDOMINAL HYSTERECTOMY     CESAREAN SECTION     LAPAROSCOPIC CHOLECYSTECTOMY SINGLE SITE WITH INTRAOPERATIVE CHOLANGIOGRAM N/A 04/07/2016   Procedure: LAPAROSCOPIC CHOLECYSTECTOMY SINGLE SITE WITH INTRAOPERATIVE CHOLANGIOGRAM;  Surgeon: Karie Soda, MD;  Location: [facility];  Service: General;  Laterality: N/A;   OTHER SURGICAL HISTORY     Right breast cyst removal--Right ovarian cyst excision   VAGINAL HYSTERECTOMY     with left salpingo-oophorectomy    Family History  Problem Relation Age of Onset   Hypertension Mother    Mitral valve prolapse Father  Allergies  Allergen Reactions   Morphine And Related Nausea And Vomiting   Ciprofloxacin Swelling   Sulfa Antibiotics Other (See Comments)    Anticoagulation problems and Bruising    Sulfa Drugs Cross Reactors Other (See Comments)    Blood will not clot, runs very thin; bruises   Tape Rash    Current Outpatient Medications on File Prior to Visit  Medication Sig Dispense Refill   B Complex Vitamins (VITAMIN B COMPLEX PO) Take 1 tablet by mouth.     busPIRone (BUSPAR) 15 MG tablet TAKE 1 TABLET BY MOUTH TWICE DAILY 180 tablet 1   CALCIUM PO Take 1 tablet by mouth daily.      Cholecalciferol (VITAMIN D3) 5000 units CAPS Take 10,000 Units by mouth daily.     Multiple Vitamins-Minerals (MULTIVITAMIN WITH MINERALS) tablet Take 1 tablet by mouth daily.     mupirocin ointment (BACTROBAN) 2 % APPLY TOPICALLY TO THE AFFECTED AREA TWICE DAILY 22 g 2   OMEGA-3 FATTY ACIDS PO Take 1 capsule by mouth.     pantoprazole (PROTONIX) 20 MG tablet TAKE 1 TABLET(20 MG) BY MOUTH DAILY 90 tablet 3   Probiotic Product (PROBIOTIC PO) Take 1 capsule by mouth daily.     sertraline (ZOLOFT) 100 MG tablet TAKE 1 TABLET(100 MG) BY MOUTH AT BEDTIME 30 tablet 0   No current facility-administered medications on file prior to visit.    BP 97/70   Pulse 71   Resp 18   Ht 5\' 7"  (1.702 m)   Wt 248 lb 9.6 oz (112.8 kg)   SpO2 94%   BMI 38.94 kg/m      Objective:   Physical Exam   General Mental Status- Alert. General Appearance- Not in acute distress.    Skin General: Color- Normal Color. Moisture- Normal Moisture.   .   Chest and Lung Exam Auscultation: Breath Sounds:-Normal.   Cardiovascular Auscultation:Rythm- Regular. Murmurs & Other Heart Sounds:Auscultation of the heart reveals- No Murmurs.     Neurologic Cranial Nerve exam:- CN III-XII intact(No nystagmus), symmetric smile. Strength:- 5/5 equal and symmetric strength both upper and lower extremities.   Rt wrist- pain at rest and worse on ulnar deviation movement.      Assessment & Plan:   Patient Instructions  Anxiety and depression well controlled with sertraline and buspar. Continue both.  presently resolved with diet alone. Glad to hear no longer needing protonix.  Great job with recent weight loss. Continue with current program.   For wrist pain dominant hand refer to sports medicine. During interim can use wrist cock up splint and low dose ibuprofen.    Follow up in 6 months or sooner if needed.

## 2021-11-14 ENCOUNTER — Ambulatory Visit: Payer: Self-pay

## 2021-11-14 ENCOUNTER — Ambulatory Visit (INDEPENDENT_AMBULATORY_CARE_PROVIDER_SITE_OTHER): Payer: BC Managed Care – PPO | Admitting: Family Medicine

## 2021-11-14 ENCOUNTER — Encounter: Payer: Self-pay | Admitting: Family Medicine

## 2021-11-14 VITALS — BP 107/73 | Ht 67.0 in | Wt 248.0 lb

## 2021-11-14 DIAGNOSIS — M25531 Pain in right wrist: Secondary | ICD-10-CM

## 2021-11-14 DIAGNOSIS — M659 Synovitis and tenosynovitis, unspecified: Secondary | ICD-10-CM | POA: Diagnosis not present

## 2021-11-14 DIAGNOSIS — S63091D Other subluxation of right wrist and hand, subsequent encounter: Secondary | ICD-10-CM | POA: Insufficient documentation

## 2021-11-14 MED ORDER — PREDNISONE 5 MG PO TABS: ORAL_TABLET | ORAL | 0 refills | Status: DC

## 2021-11-14 NOTE — Progress Notes (Signed)
  Kaitlin Jenkins - 51 y.o. female MRN [id number]  Date of birth: 1970-04-06  SUBJECTIVE:  Including CC & ROS.  No chief complaint on file.   Kaitlin Jenkins is a 51 y.o. female that is presenting with acute right wrist pain.  The pain is over the ulnar aspect.  Denies any injury inciting event.  It is painful in every direction.  No history of similar pain or surgery.  Review of the office note from 8/16 shows she was provided a Velcro brace   Review of Systems See HPI   HISTORY: Past Medical, Surgical, Social, and Family History Reviewed & Updated per EMR.   Pertinent Historical Findings include:  Past Medical History:  Diagnosis Date   Anemia    Mild normocytic anemia   Anxiety    Bacterial vaginosis    Dysmenorrhea    Endometriosis    history of endometriosis   Gastroesophageal reflux disease    H/O tubal ligation    History of depression    History of frequent urinary tract infections    Pelvic pain    Pyelonephritis    Right pyelonephritis   Sepsis(995.91)     Past Surgical History:  Procedure Laterality Date   ABDOMINAL HYSTERECTOMY     CESAREAN SECTION     LAPAROSCOPIC CHOLECYSTECTOMY SINGLE SITE WITH INTRAOPERATIVE CHOLANGIOGRAM N/A 04/07/2016   Procedure: LAPAROSCOPIC CHOLECYSTECTOMY SINGLE SITE WITH INTRAOPERATIVE CHOLANGIOGRAM;  Surgeon: Karie Soda, MD;  Location: [facility];  Service: General;  Laterality: N/A;   OTHER SURGICAL HISTORY     Right breast cyst removal--Right ovarian cyst excision   VAGINAL HYSTERECTOMY     with left salpingo-oophorectomy     PHYSICAL EXAM:  VS: BP 107/73 (BP Location: Left Arm, Patient Position: Sitting)   Ht 5\' 7"  (1.702 m)   Wt 248 lb (112.5 kg)   BMI 38.84 kg/m  Physical Exam Gen: NAD, alert, cooperative with exam, well-appearing MSK:  Neurovascularly intact    Limited ultrasound: Right wrist:  There appears to be a mild effusion within the wrist with increased hyperemia. Mild surgical knee effusion after the ulnar  styloid of the ECU. No changes of the ulna  Summary: Findings most consistent with a synovitis of the wrist  Ultrasound and interpretation by , MD    ASSESSMENT & PLAN:   Synovitis of right wrist Acutely occurring.  Has changes on the ulnar aspect. -Counseled on home exercise therapy and supportive care. -Prednisone. -Could consider further imaging or injection.

## 2021-11-14 NOTE — Assessment & Plan Note (Signed)
Acutely occurring.  Has changes on the ulnar aspect. -Counseled on home exercise therapy and supportive care. -Prednisone. -Could consider further imaging or injection.

## 2021-11-14 NOTE — Patient Instructions (Signed)
Good to see you Please use ice as needed  Please try the exercises   Please send me a message in MyChart with any questions or updates.  Please see me back in 4 weeks.   --Dr. Alexyia Guarino  

## 2021-11-25 DIAGNOSIS — L900 Lichen sclerosus et atrophicus: Secondary | ICD-10-CM | POA: Diagnosis not present

## 2021-11-25 DIAGNOSIS — N9419 Other specified dyspareunia: Secondary | ICD-10-CM | POA: Diagnosis not present

## 2021-12-08 DIAGNOSIS — L900 Lichen sclerosus et atrophicus: Secondary | ICD-10-CM | POA: Diagnosis not present

## 2021-12-08 DIAGNOSIS — N7689 Other specified inflammation of vagina and vulva: Secondary | ICD-10-CM | POA: Diagnosis not present

## 2021-12-08 DIAGNOSIS — N904 Leukoplakia of vulva: Secondary | ICD-10-CM | POA: Diagnosis not present

## 2021-12-15 DIAGNOSIS — L900 Lichen sclerosus et atrophicus: Secondary | ICD-10-CM | POA: Diagnosis not present

## 2022-01-20 DIAGNOSIS — L03317 Cellulitis of buttock: Secondary | ICD-10-CM | POA: Diagnosis not present

## 2022-01-31 IMAGING — DX DG FOOT COMPLETE 3+V*R*
3 series · 3 of 3 positions shown · non-contrast
Comparison: None.

CLINICAL DATA: Pain posteriorly for 2 weeks

EXAM:
RIGHT FOOT COMPLETE - 3+ VIEW

[foot ap]
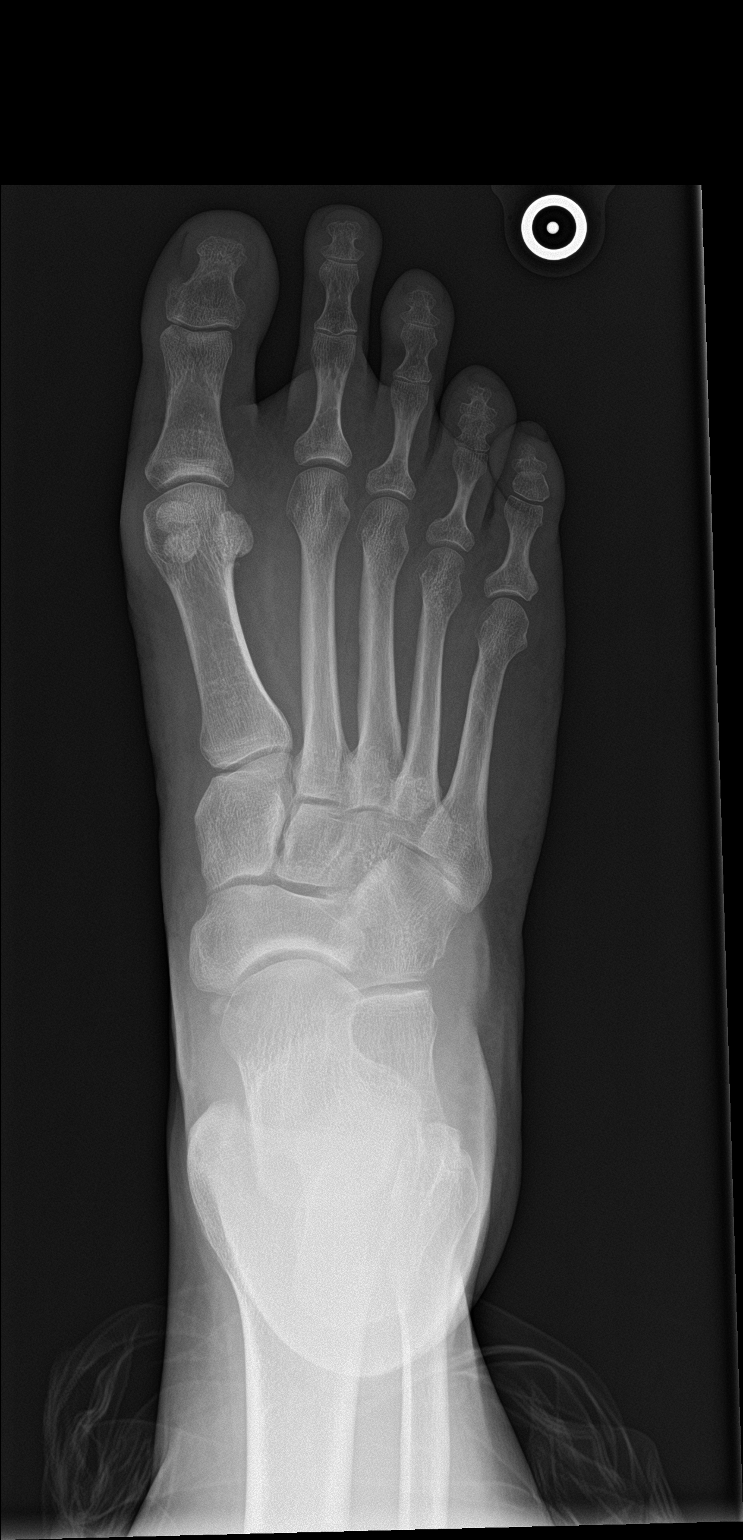

[foot obl]
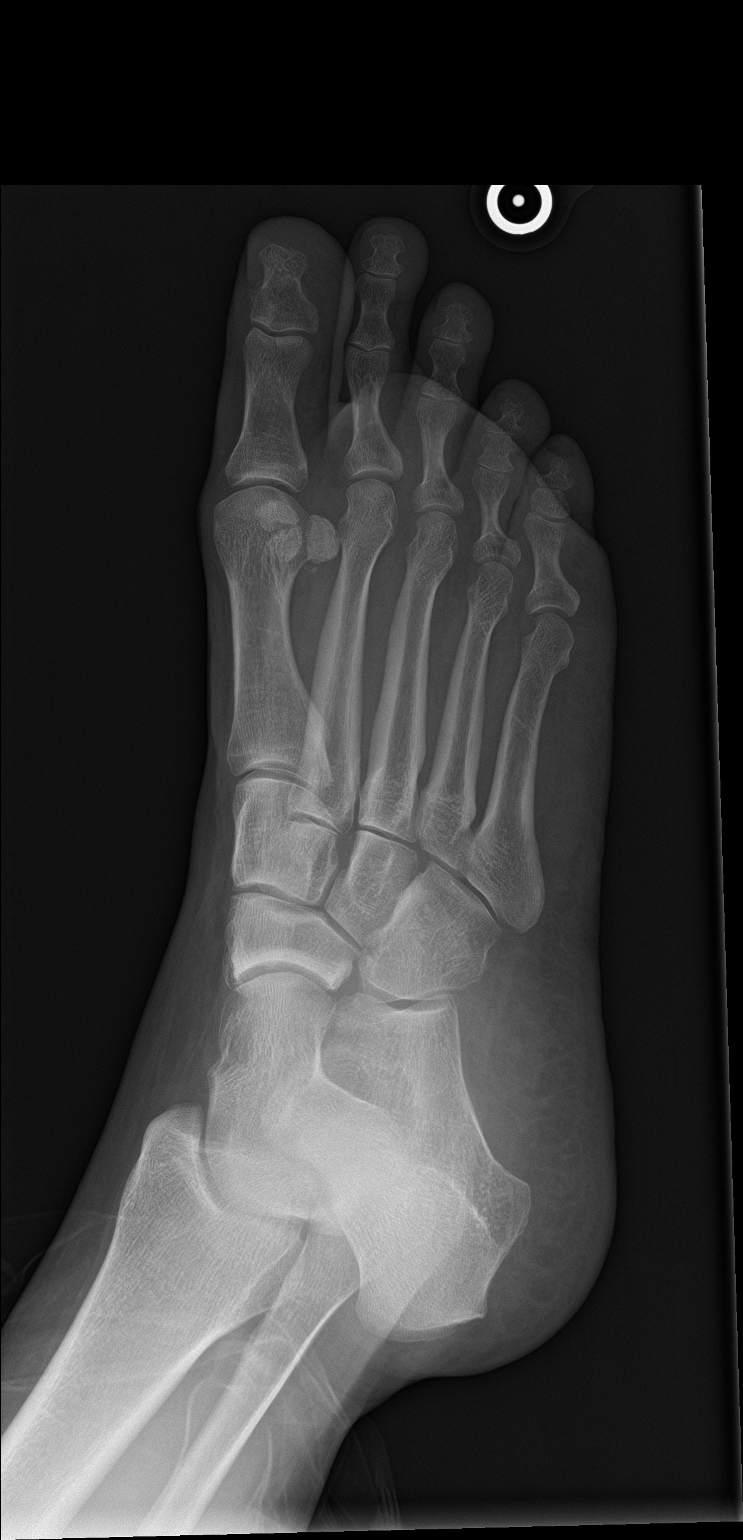

[foot lat]
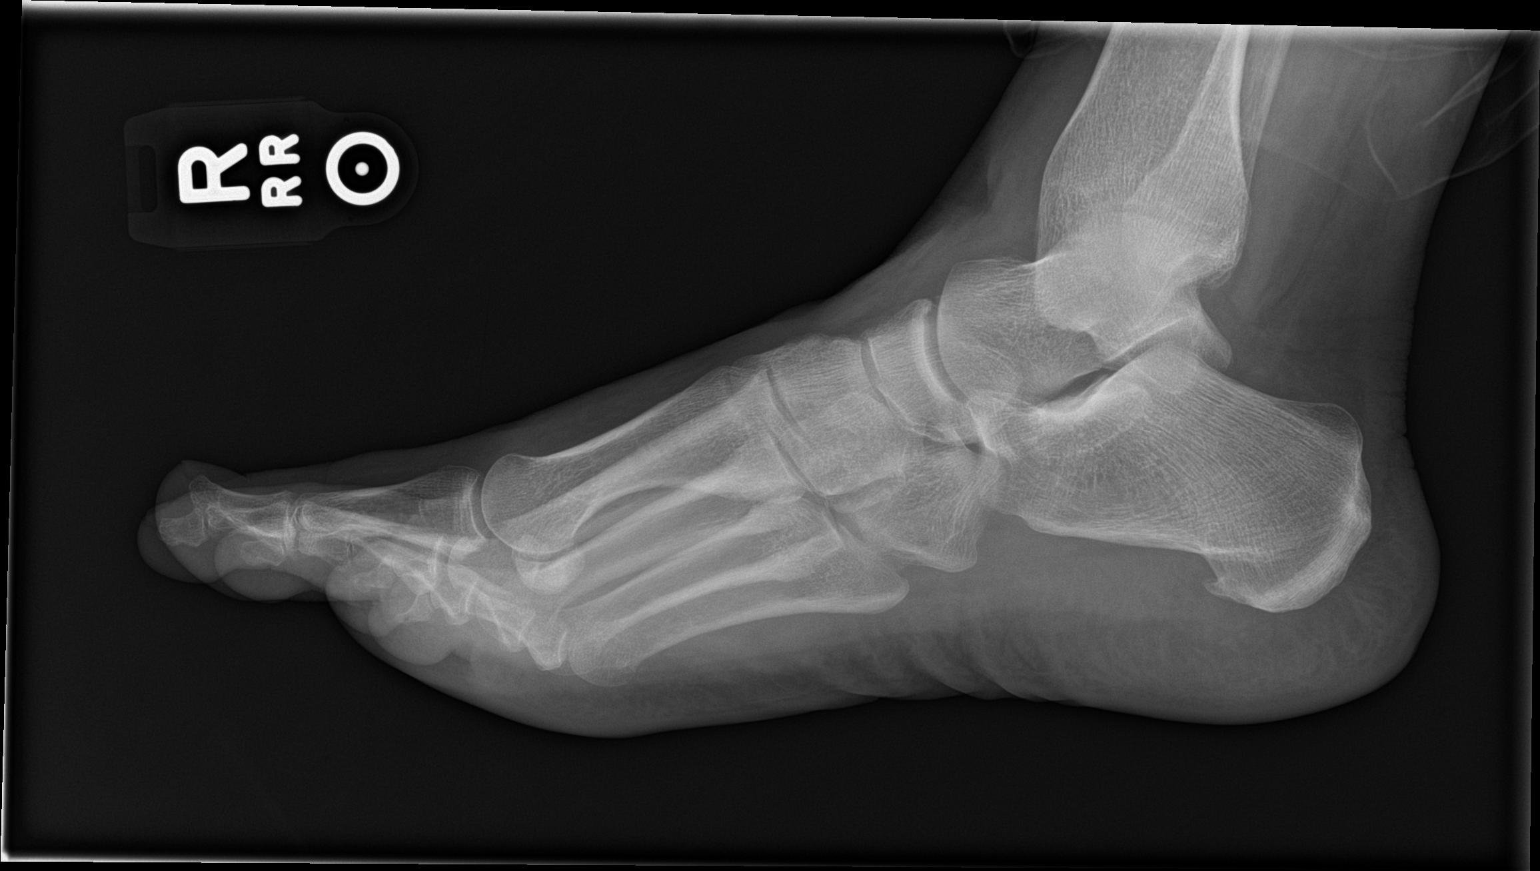

[3 of 3 positions shown; findings below may reference images not displayed]

FINDINGS: Frontal, oblique, and lateral views were obtained. No fracture or
dislocation. Joint spaces appear normal. No erosive change. There is
a small inferior calcaneal spur.
IMPRESSION: Small inferior calcaneal spur. No fracture or dislocation. No
appreciable arthropathy.

## 2022-02-14 DIAGNOSIS — M50322 Other cervical disc degeneration at C5-C6 level: Secondary | ICD-10-CM | POA: Diagnosis not present

## 2022-02-14 DIAGNOSIS — M50323 Other cervical disc degeneration at C6-C7 level: Secondary | ICD-10-CM | POA: Diagnosis not present

## 2022-02-14 DIAGNOSIS — M4722 Other spondylosis with radiculopathy, cervical region: Secondary | ICD-10-CM | POA: Diagnosis not present

## 2022-02-14 DIAGNOSIS — M47817 Spondylosis without myelopathy or radiculopathy, lumbosacral region: Secondary | ICD-10-CM | POA: Diagnosis not present

## 2022-02-20 DIAGNOSIS — M47817 Spondylosis without myelopathy or radiculopathy, lumbosacral region: Secondary | ICD-10-CM | POA: Diagnosis not present

## 2022-02-20 DIAGNOSIS — M50322 Other cervical disc degeneration at C5-C6 level: Secondary | ICD-10-CM | POA: Diagnosis not present

## 2022-02-20 DIAGNOSIS — M50323 Other cervical disc degeneration at C6-C7 level: Secondary | ICD-10-CM | POA: Diagnosis not present

## 2022-02-20 DIAGNOSIS — M4722 Other spondylosis with radiculopathy, cervical region: Secondary | ICD-10-CM | POA: Diagnosis not present

## 2022-02-22 DIAGNOSIS — M47817 Spondylosis without myelopathy or radiculopathy, lumbosacral region: Secondary | ICD-10-CM | POA: Diagnosis not present

## 2022-02-22 DIAGNOSIS — M4722 Other spondylosis with radiculopathy, cervical region: Secondary | ICD-10-CM | POA: Diagnosis not present

## 2022-02-22 DIAGNOSIS — M50322 Other cervical disc degeneration at C5-C6 level: Secondary | ICD-10-CM | POA: Diagnosis not present

## 2022-02-22 DIAGNOSIS — M50323 Other cervical disc degeneration at C6-C7 level: Secondary | ICD-10-CM | POA: Diagnosis not present

## 2022-02-23 DIAGNOSIS — M4722 Other spondylosis with radiculopathy, cervical region: Secondary | ICD-10-CM | POA: Diagnosis not present

## 2022-02-23 DIAGNOSIS — M50322 Other cervical disc degeneration at C5-C6 level: Secondary | ICD-10-CM | POA: Diagnosis not present

## 2022-02-23 DIAGNOSIS — M50323 Other cervical disc degeneration at C6-C7 level: Secondary | ICD-10-CM | POA: Diagnosis not present

## 2022-02-23 DIAGNOSIS — M47817 Spondylosis without myelopathy or radiculopathy, lumbosacral region: Secondary | ICD-10-CM | POA: Diagnosis not present

## 2022-02-27 DIAGNOSIS — M50323 Other cervical disc degeneration at C6-C7 level: Secondary | ICD-10-CM | POA: Diagnosis not present

## 2022-02-27 DIAGNOSIS — M47817 Spondylosis without myelopathy or radiculopathy, lumbosacral region: Secondary | ICD-10-CM | POA: Diagnosis not present

## 2022-02-27 DIAGNOSIS — M4722 Other spondylosis with radiculopathy, cervical region: Secondary | ICD-10-CM | POA: Diagnosis not present

## 2022-02-27 DIAGNOSIS — M50322 Other cervical disc degeneration at C5-C6 level: Secondary | ICD-10-CM | POA: Diagnosis not present

## 2022-03-01 DIAGNOSIS — M4722 Other spondylosis with radiculopathy, cervical region: Secondary | ICD-10-CM | POA: Diagnosis not present

## 2022-03-01 DIAGNOSIS — M50322 Other cervical disc degeneration at C5-C6 level: Secondary | ICD-10-CM | POA: Diagnosis not present

## 2022-03-01 DIAGNOSIS — M50323 Other cervical disc degeneration at C6-C7 level: Secondary | ICD-10-CM | POA: Diagnosis not present

## 2022-03-01 DIAGNOSIS — M47817 Spondylosis without myelopathy or radiculopathy, lumbosacral region: Secondary | ICD-10-CM | POA: Diagnosis not present

## 2022-03-02 DIAGNOSIS — M50322 Other cervical disc degeneration at C5-C6 level: Secondary | ICD-10-CM | POA: Diagnosis not present

## 2022-03-02 DIAGNOSIS — M50323 Other cervical disc degeneration at C6-C7 level: Secondary | ICD-10-CM | POA: Diagnosis not present

## 2022-03-02 DIAGNOSIS — M4722 Other spondylosis with radiculopathy, cervical region: Secondary | ICD-10-CM | POA: Diagnosis not present

## 2022-03-02 DIAGNOSIS — M47817 Spondylosis without myelopathy or radiculopathy, lumbosacral region: Secondary | ICD-10-CM | POA: Diagnosis not present

## 2022-03-06 DIAGNOSIS — M50323 Other cervical disc degeneration at C6-C7 level: Secondary | ICD-10-CM | POA: Diagnosis not present

## 2022-03-06 DIAGNOSIS — M50322 Other cervical disc degeneration at C5-C6 level: Secondary | ICD-10-CM | POA: Diagnosis not present

## 2022-03-06 DIAGNOSIS — M47817 Spondylosis without myelopathy or radiculopathy, lumbosacral region: Secondary | ICD-10-CM | POA: Diagnosis not present

## 2022-03-06 DIAGNOSIS — M4722 Other spondylosis with radiculopathy, cervical region: Secondary | ICD-10-CM | POA: Diagnosis not present

## 2022-03-08 DIAGNOSIS — M50323 Other cervical disc degeneration at C6-C7 level: Secondary | ICD-10-CM | POA: Diagnosis not present

## 2022-03-08 DIAGNOSIS — M50322 Other cervical disc degeneration at C5-C6 level: Secondary | ICD-10-CM | POA: Diagnosis not present

## 2022-03-08 DIAGNOSIS — M4722 Other spondylosis with radiculopathy, cervical region: Secondary | ICD-10-CM | POA: Diagnosis not present

## 2022-03-08 DIAGNOSIS — M47817 Spondylosis without myelopathy or radiculopathy, lumbosacral region: Secondary | ICD-10-CM | POA: Diagnosis not present

## 2022-03-09 DIAGNOSIS — M47817 Spondylosis without myelopathy or radiculopathy, lumbosacral region: Secondary | ICD-10-CM | POA: Diagnosis not present

## 2022-03-09 DIAGNOSIS — M50323 Other cervical disc degeneration at C6-C7 level: Secondary | ICD-10-CM | POA: Diagnosis not present

## 2022-03-09 DIAGNOSIS — M4722 Other spondylosis with radiculopathy, cervical region: Secondary | ICD-10-CM | POA: Diagnosis not present

## 2022-03-09 DIAGNOSIS — M50322 Other cervical disc degeneration at C5-C6 level: Secondary | ICD-10-CM | POA: Diagnosis not present

## 2022-03-13 DIAGNOSIS — M47817 Spondylosis without myelopathy or radiculopathy, lumbosacral region: Secondary | ICD-10-CM | POA: Diagnosis not present

## 2022-03-13 DIAGNOSIS — M50323 Other cervical disc degeneration at C6-C7 level: Secondary | ICD-10-CM | POA: Diagnosis not present

## 2022-03-13 DIAGNOSIS — M50322 Other cervical disc degeneration at C5-C6 level: Secondary | ICD-10-CM | POA: Diagnosis not present

## 2022-03-13 DIAGNOSIS — M4722 Other spondylosis with radiculopathy, cervical region: Secondary | ICD-10-CM | POA: Diagnosis not present

## 2022-03-16 DIAGNOSIS — M50323 Other cervical disc degeneration at C6-C7 level: Secondary | ICD-10-CM | POA: Diagnosis not present

## 2022-03-16 DIAGNOSIS — M4722 Other spondylosis with radiculopathy, cervical region: Secondary | ICD-10-CM | POA: Diagnosis not present

## 2022-03-16 DIAGNOSIS — M47817 Spondylosis without myelopathy or radiculopathy, lumbosacral region: Secondary | ICD-10-CM | POA: Diagnosis not present

## 2022-03-16 DIAGNOSIS — M50322 Other cervical disc degeneration at C5-C6 level: Secondary | ICD-10-CM | POA: Diagnosis not present

## 2022-03-22 DIAGNOSIS — M50323 Other cervical disc degeneration at C6-C7 level: Secondary | ICD-10-CM | POA: Diagnosis not present

## 2022-03-22 DIAGNOSIS — M47817 Spondylosis without myelopathy or radiculopathy, lumbosacral region: Secondary | ICD-10-CM | POA: Diagnosis not present

## 2022-03-22 DIAGNOSIS — M4722 Other spondylosis with radiculopathy, cervical region: Secondary | ICD-10-CM | POA: Diagnosis not present

## 2022-03-22 DIAGNOSIS — M50322 Other cervical disc degeneration at C5-C6 level: Secondary | ICD-10-CM | POA: Diagnosis not present

## 2022-03-23 DIAGNOSIS — M50323 Other cervical disc degeneration at C6-C7 level: Secondary | ICD-10-CM | POA: Diagnosis not present

## 2022-03-23 DIAGNOSIS — M47817 Spondylosis without myelopathy or radiculopathy, lumbosacral region: Secondary | ICD-10-CM | POA: Diagnosis not present

## 2022-03-23 DIAGNOSIS — M50322 Other cervical disc degeneration at C5-C6 level: Secondary | ICD-10-CM | POA: Diagnosis not present

## 2022-03-23 DIAGNOSIS — M4722 Other spondylosis with radiculopathy, cervical region: Secondary | ICD-10-CM | POA: Diagnosis not present

## 2022-04-04 ENCOUNTER — Other Ambulatory Visit: Payer: Self-pay | Admitting: Medical

## 2022-04-12 DIAGNOSIS — M50322 Other cervical disc degeneration at C5-C6 level: Secondary | ICD-10-CM | POA: Diagnosis not present

## 2022-04-12 DIAGNOSIS — M50323 Other cervical disc degeneration at C6-C7 level: Secondary | ICD-10-CM | POA: Diagnosis not present

## 2022-04-12 DIAGNOSIS — M47817 Spondylosis without myelopathy or radiculopathy, lumbosacral region: Secondary | ICD-10-CM | POA: Diagnosis not present

## 2022-04-12 DIAGNOSIS — M4722 Other spondylosis with radiculopathy, cervical region: Secondary | ICD-10-CM | POA: Diagnosis not present

## 2022-04-13 DIAGNOSIS — M50322 Other cervical disc degeneration at C5-C6 level: Secondary | ICD-10-CM | POA: Diagnosis not present

## 2022-04-13 DIAGNOSIS — M47817 Spondylosis without myelopathy or radiculopathy, lumbosacral region: Secondary | ICD-10-CM | POA: Diagnosis not present

## 2022-04-13 DIAGNOSIS — M4722 Other spondylosis with radiculopathy, cervical region: Secondary | ICD-10-CM | POA: Diagnosis not present

## 2022-04-13 DIAGNOSIS — M50323 Other cervical disc degeneration at C6-C7 level: Secondary | ICD-10-CM | POA: Diagnosis not present

## 2022-04-14 ENCOUNTER — Ambulatory Visit (HOSPITAL_BASED_OUTPATIENT_CLINIC_OR_DEPARTMENT_OTHER)
Admission: RE | Admit: 2022-04-14 | Discharge: 2022-04-14 | Disposition: A | Discharge status: Home / Self Care | Payer: BC Managed Care – PPO | Source: Ambulatory Visit | Attending: Family Medicine | Admitting: Family Medicine

## 2022-04-14 ENCOUNTER — Encounter: Payer: Self-pay | Admitting: Family Medicine

## 2022-04-14 ENCOUNTER — Ambulatory Visit (INDEPENDENT_AMBULATORY_CARE_PROVIDER_SITE_OTHER): Payer: BC Managed Care – PPO | Admitting: Family Medicine

## 2022-04-14 VITALS — BP 130/70 | Ht 67.0 in | Wt 270.0 lb

## 2022-04-14 DIAGNOSIS — S63091D Other subluxation of right wrist and hand, subsequent encounter: Secondary | ICD-10-CM

## 2022-04-14 DIAGNOSIS — M25531 Pain in right wrist: Secondary | ICD-10-CM | POA: Diagnosis not present

## 2022-04-14 NOTE — Patient Instructions (Signed)
Good to see you Please try a wrist wrap on the area  Please try voltaren over the counter gel  We have made a referral to physical therapy  We will call with the results.   Please send me a message in MyChart with any questions or updates.  Please see me back in 6-8 weeks.   --Dr. Raeford Razor

## 2022-04-14 NOTE — Progress Notes (Signed)
  Kaitlin Jenkins - 52 y.o. female MRN [id number]  Date of birth: 1971-03-09  SUBJECTIVE:  Including CC & ROS.  No chief complaint on file.   Kaitlin Jenkins is a 52 y.o. female that is presenting with acute on chronic right wrist pain.  The pain is still occurring on the ulnar aspect.  Is having clicking as well as weakness with grip strength.   Review of Systems See HPI   HISTORY: Past Medical, Surgical, Social, and Family History Reviewed & Updated per EMR.   Pertinent Historical Findings include:  Past Medical History:  Diagnosis Date   Anemia    Mild normocytic anemia   Anxiety    Bacterial vaginosis    Dysmenorrhea    Endometriosis    history of endometriosis   Gastroesophageal reflux disease    H/O tubal ligation    History of depression    History of frequent urinary tract infections    Pelvic pain    Pyelonephritis    Right pyelonephritis   Sepsis(995.91)     Past Surgical History:  Procedure Laterality Date   ABDOMINAL HYSTERECTOMY     CESAREAN SECTION     LAPAROSCOPIC CHOLECYSTECTOMY SINGLE SITE WITH INTRAOPERATIVE CHOLANGIOGRAM N/A 04/07/2016   Procedure: LAPAROSCOPIC CHOLECYSTECTOMY SINGLE SITE WITH INTRAOPERATIVE CHOLANGIOGRAM;  Surgeon: Michael Boston, MD;  Location: [facility];  Service: General;  Laterality: N/A;   OTHER SURGICAL HISTORY     Right breast cyst removal--Right ovarian cyst excision   VAGINAL HYSTERECTOMY     with left salpingo-oophorectomy     PHYSICAL EXAM:  VS: BP 130/70   Ht 5\' 7"  (1.702 m)   Wt 270 lb (122.5 kg)   BMI 42.29 kg/m  Physical Exam Gen: NAD, alert, cooperative with exam, well-appearing MSK:  Neurovascularly intact       ASSESSMENT & PLAN:   Subluxation of extensor carpi ulnaris tendon, right, subsequent encounter Acute on chronic in nature.  Pain is still occurring over the ulnar aspect of the wrist.  Having a snapping sensation of the ulnar styloid which seems more consistent with subluxation of the ECU  tendon. -Counseled on home exercise therapy and supportive care. -Counseled on Voltaren over-the-counter. -Referral to physical therapy. -X-ray. -Could consider further imaging or injection.

## 2022-04-14 NOTE — Assessment & Plan Note (Signed)
Acute on chronic in nature.  Pain is still occurring over the ulnar aspect of the wrist.  Having a snapping sensation of the ulnar styloid which seems more consistent with subluxation of the ECU tendon. -Counseled on home exercise therapy and supportive care. -Counseled on Voltaren over-the-counter. -Referral to physical therapy. -X-ray. -Could consider further imaging or injection.

## 2022-04-17 ENCOUNTER — Telehealth: Payer: Self-pay | Admitting: Family Medicine

## 2022-04-17 DIAGNOSIS — M50322 Other cervical disc degeneration at C5-C6 level: Secondary | ICD-10-CM | POA: Diagnosis not present

## 2022-04-17 DIAGNOSIS — M4722 Other spondylosis with radiculopathy, cervical region: Secondary | ICD-10-CM | POA: Diagnosis not present

## 2022-04-17 DIAGNOSIS — M50323 Other cervical disc degeneration at C6-C7 level: Secondary | ICD-10-CM | POA: Diagnosis not present

## 2022-04-17 DIAGNOSIS — M47817 Spondylosis without myelopathy or radiculopathy, lumbosacral region: Secondary | ICD-10-CM | POA: Diagnosis not present

## 2022-04-17 NOTE — Telephone Encounter (Signed)
Informed of results.   Rosemarie Ax, MD [facility] 04/17/2022, 9:39 AM

## 2022-04-19 DIAGNOSIS — M4722 Other spondylosis with radiculopathy, cervical region: Secondary | ICD-10-CM | POA: Diagnosis not present

## 2022-04-19 DIAGNOSIS — M47817 Spondylosis without myelopathy or radiculopathy, lumbosacral region: Secondary | ICD-10-CM | POA: Diagnosis not present

## 2022-04-19 DIAGNOSIS — M50323 Other cervical disc degeneration at C6-C7 level: Secondary | ICD-10-CM | POA: Diagnosis not present

## 2022-04-19 DIAGNOSIS — M50322 Other cervical disc degeneration at C5-C6 level: Secondary | ICD-10-CM | POA: Diagnosis not present

## 2022-04-20 DIAGNOSIS — M50323 Other cervical disc degeneration at C6-C7 level: Secondary | ICD-10-CM | POA: Diagnosis not present

## 2022-04-20 DIAGNOSIS — M50322 Other cervical disc degeneration at C5-C6 level: Secondary | ICD-10-CM | POA: Diagnosis not present

## 2022-04-20 DIAGNOSIS — M47817 Spondylosis without myelopathy or radiculopathy, lumbosacral region: Secondary | ICD-10-CM | POA: Diagnosis not present

## 2022-04-20 DIAGNOSIS — M4722 Other spondylosis with radiculopathy, cervical region: Secondary | ICD-10-CM | POA: Diagnosis not present

## 2022-04-24 DIAGNOSIS — M47817 Spondylosis without myelopathy or radiculopathy, lumbosacral region: Secondary | ICD-10-CM | POA: Diagnosis not present

## 2022-04-24 DIAGNOSIS — M4722 Other spondylosis with radiculopathy, cervical region: Secondary | ICD-10-CM | POA: Diagnosis not present

## 2022-04-24 DIAGNOSIS — M50323 Other cervical disc degeneration at C6-C7 level: Secondary | ICD-10-CM | POA: Diagnosis not present

## 2022-04-24 DIAGNOSIS — M50322 Other cervical disc degeneration at C5-C6 level: Secondary | ICD-10-CM | POA: Diagnosis not present

## 2022-04-26 DIAGNOSIS — M47817 Spondylosis without myelopathy or radiculopathy, lumbosacral region: Secondary | ICD-10-CM | POA: Diagnosis not present

## 2022-04-26 DIAGNOSIS — M50322 Other cervical disc degeneration at C5-C6 level: Secondary | ICD-10-CM | POA: Diagnosis not present

## 2022-04-26 DIAGNOSIS — M4722 Other spondylosis with radiculopathy, cervical region: Secondary | ICD-10-CM | POA: Diagnosis not present

## 2022-04-26 DIAGNOSIS — M50323 Other cervical disc degeneration at C6-C7 level: Secondary | ICD-10-CM | POA: Diagnosis not present

## 2022-05-01 DIAGNOSIS — M4722 Other spondylosis with radiculopathy, cervical region: Secondary | ICD-10-CM | POA: Diagnosis not present

## 2022-05-01 DIAGNOSIS — M50323 Other cervical disc degeneration at C6-C7 level: Secondary | ICD-10-CM | POA: Diagnosis not present

## 2022-05-01 DIAGNOSIS — M47817 Spondylosis without myelopathy or radiculopathy, lumbosacral region: Secondary | ICD-10-CM | POA: Diagnosis not present

## 2022-05-01 DIAGNOSIS — M50322 Other cervical disc degeneration at C5-C6 level: Secondary | ICD-10-CM | POA: Diagnosis not present

## 2022-05-18 DIAGNOSIS — M47817 Spondylosis without myelopathy or radiculopathy, lumbosacral region: Secondary | ICD-10-CM | POA: Diagnosis not present

## 2022-05-18 DIAGNOSIS — M4722 Other spondylosis with radiculopathy, cervical region: Secondary | ICD-10-CM | POA: Diagnosis not present

## 2022-05-18 DIAGNOSIS — M50322 Other cervical disc degeneration at C5-C6 level: Secondary | ICD-10-CM | POA: Diagnosis not present

## 2022-05-18 DIAGNOSIS — M50323 Other cervical disc degeneration at C6-C7 level: Secondary | ICD-10-CM | POA: Diagnosis not present

## 2022-05-22 DIAGNOSIS — M50322 Other cervical disc degeneration at C5-C6 level: Secondary | ICD-10-CM | POA: Diagnosis not present

## 2022-05-22 DIAGNOSIS — M50323 Other cervical disc degeneration at C6-C7 level: Secondary | ICD-10-CM | POA: Diagnosis not present

## 2022-05-22 DIAGNOSIS — M47817 Spondylosis without myelopathy or radiculopathy, lumbosacral region: Secondary | ICD-10-CM | POA: Diagnosis not present

## 2022-05-22 DIAGNOSIS — M4722 Other spondylosis with radiculopathy, cervical region: Secondary | ICD-10-CM | POA: Diagnosis not present

## 2022-05-29 ENCOUNTER — Ambulatory Visit: Payer: BC Managed Care – PPO | Admitting: Family Medicine

## 2022-05-29 DIAGNOSIS — S63091D Other subluxation of right wrist and hand, subsequent encounter: Secondary | ICD-10-CM | POA: Diagnosis not present

## 2022-05-29 DIAGNOSIS — M25531 Pain in right wrist: Secondary | ICD-10-CM | POA: Diagnosis not present

## 2022-06-01 DIAGNOSIS — M50322 Other cervical disc degeneration at C5-C6 level: Secondary | ICD-10-CM | POA: Diagnosis not present

## 2022-06-01 DIAGNOSIS — M47817 Spondylosis without myelopathy or radiculopathy, lumbosacral region: Secondary | ICD-10-CM | POA: Diagnosis not present

## 2022-06-01 DIAGNOSIS — M4722 Other spondylosis with radiculopathy, cervical region: Secondary | ICD-10-CM | POA: Diagnosis not present

## 2022-06-01 DIAGNOSIS — M50323 Other cervical disc degeneration at C6-C7 level: Secondary | ICD-10-CM | POA: Diagnosis not present

## 2022-07-10 ENCOUNTER — Encounter: Payer: Self-pay | Admitting: *Deleted

## 2022-07-10 DIAGNOSIS — L03211 Cellulitis of face: Secondary | ICD-10-CM | POA: Diagnosis not present

## 2022-08-28 ENCOUNTER — Other Ambulatory Visit: Payer: Self-pay | Admitting: Medical

## 2022-09-04 ENCOUNTER — Ambulatory Visit (INDEPENDENT_AMBULATORY_CARE_PROVIDER_SITE_OTHER): Payer: BC Managed Care – PPO | Admitting: Medical

## 2022-09-04 VITALS — BP 112/62 | HR 70 | Resp 20 | Ht 67.0 in | Wt 289.0 lb

## 2022-09-04 DIAGNOSIS — F32A Depression, unspecified: Secondary | ICD-10-CM | POA: Diagnosis not present

## 2022-09-04 DIAGNOSIS — K219 Gastro-esophageal reflux disease without esophagitis: Secondary | ICD-10-CM | POA: Diagnosis not present

## 2022-09-04 DIAGNOSIS — Z0000 Encounter for general adult medical examination without abnormal findings: Secondary | ICD-10-CM

## 2022-09-04 DIAGNOSIS — E669 Obesity, unspecified: Secondary | ICD-10-CM | POA: Diagnosis not present

## 2022-09-04 DIAGNOSIS — Z6841 Body Mass Index (BMI) 40.0 and over, adult: Secondary | ICD-10-CM

## 2022-09-04 DIAGNOSIS — R739 Hyperglycemia, unspecified: Secondary | ICD-10-CM

## 2022-09-04 LAB — CBC WITH DIFFERENTIAL/PLATELET
Basophils Absolute: 0.1 10*3/uL (ref 0.0–0.1)
Basophils Relative: 0.9 % (ref 0.0–3.0)
Eosinophils Absolute: 0.1 10*3/uL (ref 0.0–0.7)
Eosinophils Relative: 1.9 % (ref 0.0–5.0)
HCT: 43 % (ref 36.0–46.0)
Hemoglobin: 13.8 g/dL (ref 12.0–15.0)
Lymphocytes Relative: 29.2 % (ref 12.0–46.0)
Lymphs Abs: 2.1 10*3/uL (ref 0.7–4.0)
MCHC: 32 g/dL (ref 30.0–36.0)
MCV: 87.1 fl (ref 78.0–100.0)
Monocytes Absolute: 0.5 10*3/uL (ref 0.1–1.0)
Monocytes Relative: 7.2 % (ref 3.0–12.0)
Neutro Abs: 4.4 10*3/uL (ref 1.4–7.7)
Neutrophils Relative %: 60.8 % (ref 43.0–77.0)
Platelets: 295 10*3/uL (ref 150.0–400.0)
RBC: 4.94 Mil/uL (ref 3.87–5.11)
RDW: 14 % (ref 11.5–15.5)
WBC: 7.3 10*3/uL (ref 4.0–10.5)

## 2022-09-04 LAB — COMPREHENSIVE METABOLIC PANEL
ALT: 19 U/L (ref 0–35)
AST: 14 U/L (ref 0–37)
Albumin: 4 g/dL (ref 3.5–5.2)
Alkaline Phosphatase: 79 U/L (ref 39–117)
BUN: 14 mg/dL (ref 6–23)
CO2: 27 mEq/L (ref 19–32)
Calcium: 9.2 mg/dL (ref 8.4–10.5)
Chloride: 106 mEq/L (ref 96–112)
Creatinine, Ser: 0.69 mg/dL (ref 0.40–1.20)
GFR: 101.11 mL/min (ref >60.00)
Glucose, Bld: 94 mg/dL (ref 70–99)
Potassium: 4.5 mEq/L (ref 3.5–5.1)
Sodium: 141 mEq/L (ref 135–145)
Total Bilirubin: 0.4 mg/dL (ref 0.2–1.2)
Total Protein: 6.2 g/dL (ref 6.0–8.3)

## 2022-09-04 LAB — LIPID PANEL
Cholesterol: 205 mg/dL — ABNORMAL HIGH (ref 0–200)
HDL: 53.1 mg/dL (ref >39.00)
LDL Cholesterol: 122 mg/dL — ABNORMAL HIGH (ref 0–99)
NonHDL: 151.78
Total CHOL/HDL Ratio: 4
Triglycerides: 149 mg/dL (ref 0.0–149.0)
VLDL: 29.8 mg/dL (ref 0.0–40.0)

## 2022-09-04 LAB — HEMOGLOBIN A1C: Hgb A1c MFr Bld: 5.6 % (ref 4.6–6.5)

## 2022-09-04 NOTE — Patient Instructions (Addendum)
For you wellness exam today I have ordered cbc, cmp and lipid panel.  Shingrix and tdap offered but declined. If you change mind let us know and can give.  Recommend exercise and healthy diet.  We will let you know lab results as they come in.  Follow up date appointment will be determined after lab review.    Anxiety and depression well controlled on setraline and buspar.  Gerd- controlled with protonix.  Obesity- pt failed various diet over there years. Wt loss clinic, keto, low carb, jenny craig, wt watches etc and can't keep weight off. Recently restarted Bariatric clinic with [facility].  Pt not wanting to use wegovy. Or other.  Preventive Care 72-56 Years Old, Female Preventive care refers to lifestyle choices and visits with your health care provider that can promote health and wellness. Preventive care visits are also called wellness exams. What can I expect for my preventive care visit? Counseling Your health care provider may ask you questions about your: Medical history, including: Past medical problems. Family medical history. Pregnancy history. Current health, including: Menstrual cycle. Method of birth control. Emotional well-being. Home life and relationship well-being. Sexual activity and sexual health. Lifestyle, including: Alcohol, nicotine or tobacco, and drug use. Access to firearms. Diet, exercise, and sleep habits. Work and work Astronomer. Sunscreen use. Safety issues such as seatbelt and bike helmet use. Physical exam Your health care provider will check your: Height and weight. These may be used to calculate your BMI (body mass index). BMI is a measurement that tells if you are at a healthy weight. Waist circumference. This measures the distance around your waistline. This measurement also tells if you are at a healthy weight and may help predict your risk of certain diseases, such as type 2 diabetes and high blood pressure. Heart rate and blood  pressure. Body temperature. Skin for abnormal spots. What immunizations do I need?  Vaccines are usually given at various ages, according to a schedule. Your health care provider will recommend vaccines for you based on your age, medical history, and lifestyle or other factors, such as travel or where you work. What tests do I need? Screening Your health care provider may recommend screening tests for certain conditions. This may include: Lipid and cholesterol levels. Diabetes screening. This is done by checking your blood sugar (glucose) after you have not eaten for a while (fasting). Pelvic exam and Pap test. Hepatitis B test. Hepatitis C test. HIV (human immunodeficiency virus) test. STI (sexually transmitted infection) testing, if you are at risk. Lung cancer screening. Colorectal cancer screening. Mammogram. Talk with your health care provider about when you should start having regular mammograms. This may depend on whether you have a family history of breast cancer. BRCA-related cancer screening. This may be done if you have a family history of breast, ovarian, tubal, or peritoneal cancers. Bone density scan. This is done to screen for osteoporosis. Talk with your health care provider about your test results, treatment options, and if necessary, the need for more tests. Follow these instructions at home: Eating and drinking  Eat a diet that includes fresh fruits and vegetables, whole grains, lean protein, and low-fat dairy products. Take vitamin and mineral supplements as recommended by your health care provider. Do not drink alcohol if: Your health care provider tells you not to drink. You are pregnant, may be pregnant, or are planning to become pregnant. If you drink alcohol: Limit how much you have to 0-1 drink a day. Know how much alcohol  is in your drink. In the [place] one drink equals one 12 oz bottle of beer (355 mL), one 5 oz glass of wine (148 mL), or one 1 oz glass of  hard liquor (44 mL). Lifestyle Brush your teeth every morning and night with fluoride toothpaste. Floss one time each day. Exercise for at least 30 minutes 5 or more days each week. Do not use any products that contain nicotine or tobacco. These products include cigarettes, chewing tobacco, and vaping devices, such as e-cigarettes. If you need help quitting, ask your health care provider. Do not use drugs. If you are sexually active, practice safe sex. Use a condom or other form of protection to prevent STIs. If you do not wish to become pregnant, use a form of birth control. If you plan to become pregnant, see your health care provider for a prepregnancy visit. Take aspirin only as told by your health care provider. Make sure that you understand how much to take and what form to take. Work with your health care provider to find out whether it is safe and beneficial for you to take aspirin daily. Find healthy ways to manage stress, such as: Meditation, yoga, or listening to music. Journaling. Talking to a trusted person. Spending time with friends and family. Minimize exposure to UV radiation to reduce your risk of skin cancer. Safety Always wear your seat belt while driving or riding in a vehicle. Do not drive: If you have been drinking alcohol. Do not ride with someone who has been drinking. When you are tired or distracted. While texting. If you have been using any mind-altering substances or drugs. Wear a helmet and other protective equipment during sports activities. If you have firearms in your house, make sure you follow all gun safety procedures. Seek help if you have been physically or sexually abused. What's next? Visit your health care provider once a year for an annual wellness visit. Ask your health care provider how often you should have your eyes and teeth checked. Stay up to date on all vaccines. This information is not intended to replace advice given to you by your  health care provider. Make sure you discuss any questions you have with your health care provider. Document Revised: 09/08/2020 Document Reviewed: 09/08/2020 Elsevier Patient Education  2024 [employer].

## 2022-09-04 NOTE — Progress Notes (Signed)
Subjective:    Patient ID: Kaitlin Jenkins, female    DOB: 10/05/1970, 52 y.o.   MRN: [id number]  HPI  Wellness exam. Fasting.  Pt works for [employer] , pt is not able to exercise due to working a lot(50-60 hours a week). Pt states trying to eat heathy. Non smoker. Stopped 6 years ago. Alcohol very rare.    Obesity- pt states she had been planning to get gastric sleeve but she decided to do one more [place] wt loss but was too restrictive. She lost 40 lbs but then gained it back.   Pt states process but did not follow thru as wanted to try addditional diet.   Gerd- controlled with protonix. But if not on state symptoms awful. Pt has followed up with GI MD this year. She states colonoscopy done this past year.   Pt states depression and anxiety are both conrtolled/work great. On sertraline and the buspar.  Up to date on mammogram per pt but she can't find report.  Declines both Shingrix vaccine and tetanus vaccine.  Review of Systems  Constitutional:  Negative for activity change, chills, diaphoresis, fatigue and fever.  HENT:  Negative for congestion and drooling.   Respiratory:  Negative for cough, chest tightness, shortness of breath and wheezing.   Cardiovascular:  Negative for chest pain, palpitations and leg swelling.  Gastrointestinal:  Negative for abdominal pain, constipation, diarrhea, nausea and vomiting.  Genitourinary:  Negative for dysuria.  Musculoskeletal:  Negative for back pain, neck pain and neck stiffness.  Neurological:  Negative for dizziness, syncope, weakness, light-headedness and headaches.  Psychiatric/Behavioral:  Negative for agitation, behavioral problems, confusion and dysphoric mood. The patient is not nervous/anxious.        Mood is well controlled.    Past Medical History:  Diagnosis Date   Anemia    Mild normocytic anemia   Anxiety    Bacterial vaginosis    Dysmenorrhea    Endometriosis    history of endometriosis   Gastroesophageal reflux  disease    H/O tubal ligation    History of depression    History of frequent urinary tract infections    Pelvic pain    Pyelonephritis    Right pyelonephritis   Sepsis(995.91)      Social History   Socioeconomic History   Marital status: Married    Spouse name: Not on file   Number of children: Not on file   Years of education: Not on file   Highest education level: Not on file  Occupational History   Not on file  Tobacco Use   Smoking status: Former   Smokeless tobacco: Never  Substance and Sexual Activity   Alcohol use: No   Drug use: No   Sexual activity: Not on file  Other Topics Concern   Not on file  Social History Narrative   Not on file   Social Determinants of Health   Financial Resource Strain: Not on file  Food Insecurity: Not on file  Transportation Needs: Not on file  Physical Activity: Not on file  Stress: Not on file  Social Connections: Not on file  Intimate Partner Violence: Not on file    Past Surgical History:  Procedure Laterality Date   ABDOMINAL HYSTERECTOMY     CESAREAN SECTION     LAPAROSCOPIC CHOLECYSTECTOMY SINGLE SITE WITH INTRAOPERATIVE CHOLANGIOGRAM N/A 04/07/2016   Procedure: LAPAROSCOPIC CHOLECYSTECTOMY SINGLE SITE WITH INTRAOPERATIVE CHOLANGIOGRAM;  Surgeon: Karie Soda, MD;  Location: [facility];  Service: General;  Laterality:  N/A;   OTHER SURGICAL HISTORY     Right breast cyst removal--Right ovarian cyst excision   VAGINAL HYSTERECTOMY     with left salpingo-oophorectomy    Family History  Problem Relation Age of Onset   Hypertension Mother    Mitral valve prolapse Father     Allergies  Allergen Reactions   Morphine And Codeine Nausea And Vomiting   Ciprofloxacin Swelling   Sulfa Antibiotics Other (See Comments)    Anticoagulation problems and Bruising    Sulfa Drugs Cross Reactors Other (See Comments)    Blood will not clot, runs very thin; bruises   Tape Rash    Current Outpatient Medications on File Prior to  Visit  Medication Sig Dispense Refill   B Complex Vitamins (VITAMIN B COMPLEX PO) Take 1 tablet by mouth.     busPIRone (BUSPAR) 15 MG tablet TAKE 1 TABLET BY MOUTH TWICE DAILY 180 tablet 1   CALCIUM PO Take 1 tablet by mouth daily.     Cholecalciferol (VITAMIN D3) 5000 units CAPS Take 10,000 Units by mouth daily.     Multiple Vitamins-Minerals (MULTIVITAMIN WITH MINERALS) tablet Take 1 tablet by mouth daily.     mupirocin ointment (BACTROBAN) 2 % APPLY TOPICALLY TO THE AFFECTED AREA TWICE DAILY 22 g 2   OMEGA-3 FATTY ACIDS PO Take 1 capsule by mouth.     Probiotic Product (PROBIOTIC PO) Take 1 capsule by mouth daily.     sertraline (ZOLOFT) 100 MG tablet TAKE 1 TABLET(100 MG) BY MOUTH AT BEDTIME 30 tablet 11   No current facility-administered medications on file prior to visit.    BP 112/62   Pulse 70   Resp 20   Ht 5\' 7"  (1.702 m)   Wt 289 lb (131.1 kg)   SpO2 95%   BMI 45.26 kg/m        Objective:   Physical Exam  General Mental Status- Alert. General Appearance- Not in acute distress.   Skin General: Color- Normal Color. Moisture- Normal Moisture.  Neck Carotid Arteries- Normal color. Moisture- Normal Moisture. No carotid bruits. No JVD.  Chest and Lung Exam Auscultation: Breath Sounds:-Normal.  Cardiovascular Auscultation:Rythm- Regular. Murmurs & Other Heart Sounds:Auscultation of the heart reveals- No Murmurs.  Abdomen Inspection:-Inspeection Normal. Palpation/Percussion:Note:No mass. Palpation and Percussion of the abdomen reveal- Non Tender, Non Distended + BS, no rebound or guarding.    Neurologic Cranial Nerve exam:- CN III-XII intact(No nystagmus), symmetric smile. Drift Test:- No drift. Romberg Exam:- Negative.  Heal to Toe Gait exam:-Normal. Finger to Nose:- Normal/Intact Strength:- 5/5 equal and symmetric strength both upper and lower extremities.       Assessment & Plan:   Patient Instructions  For you wellness exam today I have ordered  cbc, cmp and  lipid panel.  Shingrix and tdap offered but declined. If you change mind let us know and can give.  Recommend exercise and healthy diet.  We will let you know lab results as they come in.  Follow up date appointment will be determined after lab review.    Anxiety and depression well controlled on setraline and buspar.  Gerd- controlled with protonix.  Obesity- pt failed various diet over there years. Wt loss clinic, keto, low carb, jenny craig, wt watches etc and can't keep weight off. Recently restarted Bariatric clinic with [facility].  Pt not wanting to use wegovy.           13086 charge as had not seen since 10-2021. Update chronic conditions and will  continue current meds.

## 2022-09-08 ENCOUNTER — Encounter: Payer: Self-pay | Admitting: Medical

## 2022-09-09 MED ORDER — PANTOPRAZOLE SODIUM 40 MG PO TBEC: 40.0000 mg | DELAYED_RELEASE_TABLET | Freq: Every day | ORAL | 3 refills | Status: DC

## 2022-09-09 NOTE — Addendum Note (Signed)
Addended by: Gwenevere Abbot on: 09/09/2022 09:28 AM   Modules accepted: Orders

## 2022-10-02 DIAGNOSIS — Z131 Encounter for screening for diabetes mellitus: Secondary | ICD-10-CM | POA: Diagnosis not present

## 2022-10-02 DIAGNOSIS — R5383 Other fatigue: Secondary | ICD-10-CM | POA: Diagnosis not present

## 2022-10-02 DIAGNOSIS — R0602 Shortness of breath: Secondary | ICD-10-CM | POA: Diagnosis not present

## 2022-10-02 DIAGNOSIS — Z6841 Body Mass Index (BMI) 40.0 and over, adult: Secondary | ICD-10-CM | POA: Diagnosis not present

## 2022-10-02 DIAGNOSIS — R635 Abnormal weight gain: Secondary | ICD-10-CM | POA: Diagnosis not present

## 2022-10-28 ENCOUNTER — Other Ambulatory Visit: Payer: Self-pay | Admitting: Medical

## 2022-10-30 DIAGNOSIS — R7303 Prediabetes: Secondary | ICD-10-CM | POA: Diagnosis not present

## 2022-10-30 DIAGNOSIS — Z6841 Body Mass Index (BMI) 40.0 and over, adult: Secondary | ICD-10-CM | POA: Diagnosis not present

## 2022-10-30 DIAGNOSIS — R635 Abnormal weight gain: Secondary | ICD-10-CM | POA: Diagnosis not present

## 2022-12-24 DIAGNOSIS — M25532 Pain in left wrist: Secondary | ICD-10-CM | POA: Diagnosis not present

## 2022-12-24 DIAGNOSIS — M79642 Pain in left hand: Secondary | ICD-10-CM | POA: Diagnosis not present

## 2022-12-24 DIAGNOSIS — Z6841 Body Mass Index (BMI) 40.0 and over, adult: Secondary | ICD-10-CM | POA: Diagnosis not present

## 2023-01-02 ENCOUNTER — Other Ambulatory Visit: Payer: Self-pay | Admitting: Medical

## 2023-02-01 ENCOUNTER — Other Ambulatory Visit: Payer: Self-pay | Admitting: Medical

## 2023-05-28 ENCOUNTER — Other Ambulatory Visit: Payer: Self-pay | Admitting: Medical

## 2023-09-10 ENCOUNTER — Encounter: Admitting: Medical

## 2023-11-03 ENCOUNTER — Other Ambulatory Visit: Payer: Self-pay | Admitting: Medical

## 2024-03-31 ENCOUNTER — Telehealth: Payer: Self-pay

## 2024-03-31 ENCOUNTER — Other Ambulatory Visit: Payer: Self-pay | Admitting: Medical

## 2024-03-31 NOTE — Telephone Encounter (Signed)
 Called pt to notify her I could not refill her buspar  due to her not being seen in 1.5 years. We were able to schedule a apt for 04/14/2024

## 2024-04-14 ENCOUNTER — Ambulatory Visit: Payer: Self-pay | Admitting: Medical

## 2024-04-14 ENCOUNTER — Ambulatory Visit: Admitting: Medical

## 2024-04-14 VITALS — BP 128/82 | HR 91 | Temp 98.0°F | Resp 15 | Ht 67.0 in | Wt 269.0 lb

## 2024-04-14 DIAGNOSIS — R051 Acute cough: Secondary | ICD-10-CM | POA: Diagnosis not present

## 2024-04-14 DIAGNOSIS — J029 Acute pharyngitis, unspecified: Secondary | ICD-10-CM

## 2024-04-14 DIAGNOSIS — F419 Anxiety disorder, unspecified: Secondary | ICD-10-CM

## 2024-04-14 DIAGNOSIS — M791 Myalgia, unspecified site: Secondary | ICD-10-CM | POA: Diagnosis not present

## 2024-04-14 LAB — POC COVID19/FLU A&B COMBO
Covid Antigen, POC: NEGATIVE
Influenza A Antigen, POC: NEGATIVE
Influenza B Antigen, POC: NEGATIVE

## 2024-04-14 LAB — POCT RAPID STREP A (OFFICE): Rapid Strep A Screen: POSITIVE — AB

## 2024-04-14 MED ORDER — FLUTICASONE PROPIONATE 50 MCG/ACT NA SUSP: 2.0000 | Freq: Every day | NASAL | 1 refills | Status: AC

## 2024-04-14 MED ORDER — BENZONATATE 100 MG PO CAPS: 100.0000 mg | ORAL_CAPSULE | Freq: Three times a day (TID) | ORAL | 0 refills | Status: AC | PRN

## 2024-04-14 MED ORDER — FLUCONAZOLE 150 MG PO TABS: 150.0000 mg | ORAL_TABLET | Freq: Every day | ORAL | 0 refills | Status: AC

## 2024-04-14 MED ORDER — AMOXICILLIN-POT CLAVULANATE 875-125 MG PO TABS: 1.0000 | ORAL_TABLET | Freq: Two times a day (BID) | ORAL | 0 refills | Status: AC

## 2024-04-14 MED ORDER — BUSPIRONE HCL 15 MG PO TABS: ORAL_TABLET | ORAL | 0 refills | Status: AC

## 2024-04-14 NOTE — Progress Notes (Signed)
" ° °  Subjective:    Patient ID: Kaitlin Jenkins, female    DOB: September 08, 1970, 54 y.o.   MRN: [id number]  HPI  Kaitlin Jenkins is a 54 year old female who presents with symptoms following a recent cruise, including sore throat, cough, and fatigue.  She began feeling ill Friday evening after returning from a cruise, with a scratchy sore throat that has worsened, stuffy nose, headache, heavy sweating, and marked fatigue with minimal activity. She denies known fever.  Pt tells me has nasal congstion, st, laryngitis, sweats, bodyaches and fatigue. Cough moderate. Some mild loose stools. Feels congested and slight dyspnea worse when using mask. She had covid exposure on cruise but test negative.  She takes Kaitlin Jenkins  nightly and Kaitlin Jenkins  for anxiety. She ran out of Kaitlin Jenkins  during the cruise and used Kaitlin Jenkins  alone. She has three days of Kaitlin Jenkins  remaining and requests a refill. Her father is receiving chemotherapy and is staying with her sister to avoid exposure to her current illness.        Review of Systems  See hpi       Objective:   Physical Exam   General- No acute distress. Pleasant patient. Neck- Full range of motion, no jvd Lungs- Clear, even and unlabored. Heart- regular rate and rhythm. Neurologic- CNII- XII grossly intact.  Heent-maxillry sinus pressure to palpationl. TMS normal. Posterior pharynx mild red and hypertrophy. No exudate.     Assessment & Plan:   Streptococcal pharyngitis with sinus pressure and uri sympotms. Other symptoms associated with strep ha, muscles aches fatigue and sweating. Acute streptococcal pharyngitis confirmed by positive strep test. Negative COVID and flu tests. Oxygen saturation at 97%. - Prescribed Augmentin  1 tablet twice daily for 10 days. - Prescribed diflucan  for potential yeast infection with antibiotics. - Prescribed Flonase  for nasal congestion. - Prescribed benzonatate  for cough. - Advised hydration and use of acetaminophen  or  ibuprofen for body aches and headache. - Recommended over-the-counter COVID test if symptoms persist or worsen.(since did have exposure)  Anxiety disorder Well-managed with current medication regimen. Kaitlin Jenkins  and Kaitlin Jenkins  effective. - Refilled Kaitlin Jenkins  prescription for 90 days, 1 tablet daily as does not need twice daily per pt. - Continue Kaitlin Jenkins  at night.  Follow up 7-10 days or sooner if needed for above. But also schedule wellness exam or send copy of labs done thru work so can verify everything done. "

## 2024-04-14 NOTE — Patient Instructions (Addendum)
 Streptococcal pharyngitis with sinus pressure and uri sympotms. Other symptoms associated with strep ha, muscles aches fatigue and sweating. Acute streptococcal pharyngitis confirmed by positive strep test. Negative COVID and flu tests. Oxygen saturation at 97%. - Prescribed Augmentin  1 tablet twice daily for 10 days. - Prescribed diflucan  for potential yeast infection with antibiotics. - Prescribed Flonase  for nasal congestion. - Prescribed benzonatate  for cough. - Advised hydration and use of acetaminophen  or ibuprofen for body aches and headache. - Recommended over-the-counter COVID test if symptoms persist or worsen.(since did have exposure)  Anxiety disorder Well-managed with current medication regimen. Buspirone  and sertraline  effective. - Refilled Buspirone  prescription for 90 days, 1 tablet daily as does not need twice daily per pt. - Continue sertraline  at night.  Follow up 7-10 days or sooner if needed for above. But also schedule wellness exam or send copy of labs done thru work so can verify everything done.
